# Patient Record
Sex: Female | Born: 1945
Health system: Southern US, Community
[De-identification: ages and names within clinical notes are randomized; demographics above are authoritative.]

## PROBLEM LIST (undated history)

## (undated) DIAGNOSIS — T7840XA Allergy, unspecified, initial encounter: Secondary | ICD-10-CM

## (undated) DIAGNOSIS — F32A Depression, unspecified: Secondary | ICD-10-CM

## (undated) DIAGNOSIS — G309 Alzheimer's disease, unspecified: Secondary | ICD-10-CM

## (undated) DIAGNOSIS — E785 Hyperlipidemia, unspecified: Secondary | ICD-10-CM

## (undated) DIAGNOSIS — F419 Anxiety disorder, unspecified: Secondary | ICD-10-CM

## (undated) DIAGNOSIS — F329 Major depressive disorder, single episode, unspecified: Secondary | ICD-10-CM

## (undated) DIAGNOSIS — F0280 Dementia in other diseases classified elsewhere without behavioral disturbance: Secondary | ICD-10-CM

## (undated) DIAGNOSIS — G40A01 Absence epileptic syndrome, not intractable, with status epilepticus: Secondary | ICD-10-CM

## (undated) DIAGNOSIS — F0390 Unspecified dementia without behavioral disturbance: Secondary | ICD-10-CM

## (undated) DIAGNOSIS — G43909 Migraine, unspecified, not intractable, without status migrainosus: Secondary | ICD-10-CM

## (undated) HISTORY — DX: Anxiety disorder, unspecified: F41.9

## (undated) HISTORY — DX: Allergy, unspecified, initial encounter: T78.40XA

## (undated) HISTORY — PX: ABDOMINAL HYSTERECTOMY: SHX81

## (undated) HISTORY — PX: CATARACT EXTRACTION: SUR2

## (undated) HISTORY — DX: Alzheimer's disease, unspecified: G30.9

## (undated) HISTORY — DX: Dementia in other diseases classified elsewhere, unspecified severity, without behavioral disturbance, psychotic disturbance, mood disturbance, and anxiety: F02.80

## (undated) HISTORY — DX: Migraine, unspecified, not intractable, without status migrainosus: G43.909

## (undated) HISTORY — DX: Absence epileptic syndrome, not intractable, with status epilepticus: G40.A01

## (undated) HISTORY — DX: Hyperlipidemia, unspecified: E78.5

## (undated) HISTORY — DX: Depression, unspecified: F32.A

## (undated) HISTORY — PX: OTHER SURGICAL HISTORY: SHX169

## (undated) HISTORY — DX: Major depressive disorder, single episode, unspecified: F32.9

---

## 1999-11-23 ENCOUNTER — Encounter: Payer: Self-pay | Admitting: Family Medicine

## 1999-11-23 ENCOUNTER — Encounter: Admission: RE | Admit: 1999-11-23 | Discharge: 1999-11-23 | Payer: Self-pay | Admitting: Family Medicine

## 2005-03-14 ENCOUNTER — Ambulatory Visit: Payer: Self-pay | Admitting: Internal Medicine

## 2005-06-26 ENCOUNTER — Ambulatory Visit: Payer: Self-pay | Admitting: Gynecologic Oncology

## 2005-09-18 ENCOUNTER — Ambulatory Visit: Payer: Self-pay | Admitting: Gynecologic Oncology

## 2005-12-25 ENCOUNTER — Ambulatory Visit: Payer: Self-pay | Admitting: Gynecologic Oncology

## 2006-05-28 ENCOUNTER — Ambulatory Visit: Payer: Self-pay | Admitting: Internal Medicine

## 2006-10-17 ENCOUNTER — Ambulatory Visit: Payer: Self-pay | Admitting: Gastroenterology

## 2006-11-19 ENCOUNTER — Ambulatory Visit: Payer: Self-pay | Admitting: Ophthalmology

## 2006-12-18 ENCOUNTER — Ambulatory Visit: Payer: Self-pay | Admitting: Family Medicine

## 2007-01-29 ENCOUNTER — Ambulatory Visit: Payer: Self-pay | Admitting: Internal Medicine

## 2007-03-06 ENCOUNTER — Ambulatory Visit: Payer: Self-pay | Admitting: Orthopaedic Surgery

## 2007-07-03 ENCOUNTER — Ambulatory Visit: Payer: Self-pay | Admitting: Internal Medicine

## 2008-01-28 ENCOUNTER — Ambulatory Visit: Payer: Self-pay | Admitting: Internal Medicine

## 2008-02-01 ENCOUNTER — Ambulatory Visit: Payer: Self-pay | Admitting: Internal Medicine

## 2008-09-28 ENCOUNTER — Ambulatory Visit: Payer: Self-pay | Admitting: Internal Medicine

## 2010-04-10 ENCOUNTER — Ambulatory Visit: Payer: Self-pay | Admitting: Internal Medicine

## 2010-05-02 ENCOUNTER — Emergency Department: Payer: Self-pay | Admitting: Emergency Medicine

## 2011-06-25 ENCOUNTER — Ambulatory Visit: Payer: Self-pay | Admitting: Internal Medicine

## 2011-12-20 ENCOUNTER — Ambulatory Visit: Payer: Self-pay | Admitting: Neurology

## 2012-06-10 ENCOUNTER — Ambulatory Visit: Payer: Self-pay | Admitting: Ophthalmology

## 2012-06-26 ENCOUNTER — Ambulatory Visit: Payer: Self-pay | Admitting: Internal Medicine

## 2012-09-09 ENCOUNTER — Inpatient Hospital Stay (HOSPITAL_COMMUNITY): Payer: Self-pay | Admitting: Occupational Therapy

## 2012-09-09 ENCOUNTER — Inpatient Hospital Stay (HOSPITAL_COMMUNITY): Payer: Self-pay

## 2012-09-10 ENCOUNTER — Inpatient Hospital Stay (HOSPITAL_COMMUNITY): Payer: Self-pay

## 2012-09-10 ENCOUNTER — Inpatient Hospital Stay (HOSPITAL_COMMUNITY): Payer: Self-pay | Admitting: Occupational Therapy

## 2013-06-28 ENCOUNTER — Ambulatory Visit: Payer: Self-pay

## 2014-04-14 DIAGNOSIS — F0280 Dementia in other diseases classified elsewhere without behavioral disturbance: Secondary | ICD-10-CM | POA: Diagnosis not present

## 2014-04-14 DIAGNOSIS — G301 Alzheimer's disease with late onset: Secondary | ICD-10-CM | POA: Diagnosis not present

## 2014-05-03 DIAGNOSIS — E039 Hypothyroidism, unspecified: Secondary | ICD-10-CM | POA: Diagnosis not present

## 2014-05-03 DIAGNOSIS — E782 Mixed hyperlipidemia: Secondary | ICD-10-CM | POA: Diagnosis not present

## 2014-05-03 DIAGNOSIS — M67442 Ganglion, left hand: Secondary | ICD-10-CM | POA: Diagnosis not present

## 2014-05-03 DIAGNOSIS — Z0001 Encounter for general adult medical examination with abnormal findings: Secondary | ICD-10-CM | POA: Diagnosis not present

## 2014-05-03 DIAGNOSIS — M810 Age-related osteoporosis without current pathological fracture: Secondary | ICD-10-CM | POA: Diagnosis not present

## 2014-05-03 DIAGNOSIS — D510 Vitamin B12 deficiency anemia due to intrinsic factor deficiency: Secondary | ICD-10-CM | POA: Diagnosis not present

## 2014-05-03 DIAGNOSIS — G309 Alzheimer's disease, unspecified: Secondary | ICD-10-CM | POA: Diagnosis not present

## 2014-05-03 DIAGNOSIS — E559 Vitamin D deficiency, unspecified: Secondary | ICD-10-CM | POA: Diagnosis not present

## 2014-05-05 DIAGNOSIS — M67442 Ganglion, left hand: Secondary | ICD-10-CM | POA: Diagnosis not present

## 2014-05-19 DIAGNOSIS — Z961 Presence of intraocular lens: Secondary | ICD-10-CM | POA: Diagnosis not present

## 2014-07-06 DIAGNOSIS — Z1231 Encounter for screening mammogram for malignant neoplasm of breast: Secondary | ICD-10-CM | POA: Diagnosis not present

## 2014-07-22 NOTE — Op Note (Signed)
PATIENT NAME:  Joan Peters, TINCH MR#:  [id number] DATE OF BIRTH:  19-Feb-1946  DATE OF PROCEDURE:  06/10/2012  PREOPERATIVE DIAGNOSIS:  Senile cataract, left eye.  POSTOPERATIVE DIAGNOSIS:  Senile cataract, left eye.  PROCEDURE:  Phacoemulsification with posterior chamber intraocular lens implantation of the left eye.  LENS: ZCBOO 20.5-diopter posterior chamber intraocular lens.  ULTRASOUND TIME:  12% of 1 minute, 23 seconds.  CDE 9.7.   SURGEON:  Mali Jaelle Campanile, MD  ANESTHESIA:  Topical with tetracaine drops and 2% Xylocaine jelly.  COMPLICATIONS:  None.  DESCRIPTION OF PROCEDURE:  The patient was identified in the holding room and transported to the operating room and placed in the supine position under the operating microscope.  The left eye was identified as the operative eye and it was prepped and draped in the usual sterile ophthalmic fashion.  A 1 millimeter clear-corneal paracentesis was made at the 1:30 position.  The anterior chamber was filled with Viscoat viscoelastic.  A 2.4 millimeter keratome was used to make a near-clear corneal incision at the 10:30 position.  A curvilinear capsulorrhexis was made with a cystotome and capsulorrhexis forceps.  Balanced salt solution was used to hydrodissect and hydrodelineate the nucleus.  Phacoemulsification was then used in stop and chop fashion to remove the lens nucleus and epinucleus.  The remaining cortex was then removed using the irrigation and aspiration handpiece. Provisc was then placed into the capsular bag to distend it for lens placement.  A ZCBOO 20.5-diopter lens was then injected into the capsular bag.  The remaining viscoelastic was aspirated.  Wounds were hydrated with balanced salt solution.  The anterior chamber was inflated to a physiologic pressure with balanced salt solution.  0.1 mL of cefuroxime 10 mg/mL were injected into the anterior chamber for a dose of 1 mg of intracameral antibiotic at the completion of the case.  Miostat was placed into the anterior chamber to constrict the pupil.  No wound leaks were noted.  Topical Vigamox drops and Maxitrol ointment were applied to the eye.  The patient was taken to the recovery room in stable condition without complications of anesthesia or surgery.   ____________________________ Wyonia Hough, MD crb:cb D: 06/10/2012 16:04:11 ET T: 06/10/2012 16:38:33 ET JOB#: [id number]  cc: Wyonia Hough, MD, <Dictator> Leandrew Koyanagi MD ELECTRONICALLY SIGNED 06/17/2012 12:47

## 2014-10-18 DIAGNOSIS — G301 Alzheimer's disease with late onset: Secondary | ICD-10-CM | POA: Diagnosis not present

## 2014-10-18 DIAGNOSIS — R569 Unspecified convulsions: Secondary | ICD-10-CM | POA: Diagnosis not present

## 2014-10-18 DIAGNOSIS — F0280 Dementia in other diseases classified elsewhere without behavioral disturbance: Secondary | ICD-10-CM | POA: Diagnosis not present

## 2014-10-31 DIAGNOSIS — Z0000 Encounter for general adult medical examination without abnormal findings: Secondary | ICD-10-CM | POA: Diagnosis not present

## 2014-11-01 DIAGNOSIS — G309 Alzheimer's disease, unspecified: Secondary | ICD-10-CM | POA: Diagnosis not present

## 2014-11-01 DIAGNOSIS — Z0001 Encounter for general adult medical examination with abnormal findings: Secondary | ICD-10-CM | POA: Diagnosis not present

## 2014-11-01 DIAGNOSIS — E782 Mixed hyperlipidemia: Secondary | ICD-10-CM | POA: Diagnosis not present

## 2014-11-01 DIAGNOSIS — M810 Age-related osteoporosis without current pathological fracture: Secondary | ICD-10-CM | POA: Diagnosis not present

## 2014-11-01 DIAGNOSIS — F411 Generalized anxiety disorder: Secondary | ICD-10-CM | POA: Diagnosis not present

## 2014-11-08 DIAGNOSIS — E2839 Other primary ovarian failure: Secondary | ICD-10-CM | POA: Diagnosis not present

## 2014-11-08 DIAGNOSIS — M810 Age-related osteoporosis without current pathological fracture: Secondary | ICD-10-CM | POA: Diagnosis not present

## 2014-11-08 DIAGNOSIS — M85862 Other specified disorders of bone density and structure, left lower leg: Secondary | ICD-10-CM | POA: Diagnosis not present

## 2015-04-20 DIAGNOSIS — G43109 Migraine with aura, not intractable, without status migrainosus: Secondary | ICD-10-CM | POA: Diagnosis not present

## 2015-04-20 DIAGNOSIS — F0280 Dementia in other diseases classified elsewhere without behavioral disturbance: Secondary | ICD-10-CM | POA: Diagnosis not present

## 2015-04-20 DIAGNOSIS — R569 Unspecified convulsions: Secondary | ICD-10-CM | POA: Diagnosis not present

## 2015-04-20 DIAGNOSIS — G301 Alzheimer's disease with late onset: Secondary | ICD-10-CM | POA: Diagnosis not present

## 2015-05-01 DIAGNOSIS — E039 Hypothyroidism, unspecified: Secondary | ICD-10-CM | POA: Diagnosis not present

## 2015-05-01 DIAGNOSIS — E782 Mixed hyperlipidemia: Secondary | ICD-10-CM | POA: Diagnosis not present

## 2015-05-01 DIAGNOSIS — G309 Alzheimer's disease, unspecified: Secondary | ICD-10-CM | POA: Diagnosis not present

## 2015-05-01 DIAGNOSIS — M810 Age-related osteoporosis without current pathological fracture: Secondary | ICD-10-CM | POA: Diagnosis not present

## 2015-05-01 DIAGNOSIS — F411 Generalized anxiety disorder: Secondary | ICD-10-CM | POA: Diagnosis not present

## 2015-05-18 DIAGNOSIS — I10 Essential (primary) hypertension: Secondary | ICD-10-CM | POA: Diagnosis not present

## 2015-05-18 DIAGNOSIS — E559 Vitamin D deficiency, unspecified: Secondary | ICD-10-CM | POA: Diagnosis not present

## 2015-05-18 DIAGNOSIS — Z0001 Encounter for general adult medical examination with abnormal findings: Secondary | ICD-10-CM | POA: Diagnosis not present

## 2015-05-18 DIAGNOSIS — E782 Mixed hyperlipidemia: Secondary | ICD-10-CM | POA: Diagnosis not present

## 2015-06-07 DIAGNOSIS — Z85828 Personal history of other malignant neoplasm of skin: Secondary | ICD-10-CM | POA: Diagnosis not present

## 2015-06-07 DIAGNOSIS — D1801 Hemangioma of skin and subcutaneous tissue: Secondary | ICD-10-CM | POA: Diagnosis not present

## 2015-06-07 DIAGNOSIS — L821 Other seborrheic keratosis: Secondary | ICD-10-CM | POA: Diagnosis not present

## 2015-07-10 DIAGNOSIS — Z1231 Encounter for screening mammogram for malignant neoplasm of breast: Secondary | ICD-10-CM | POA: Diagnosis not present

## 2015-08-10 DIAGNOSIS — E782 Mixed hyperlipidemia: Secondary | ICD-10-CM | POA: Diagnosis not present

## 2015-08-10 DIAGNOSIS — G309 Alzheimer's disease, unspecified: Secondary | ICD-10-CM | POA: Diagnosis not present

## 2015-08-10 DIAGNOSIS — R55 Syncope and collapse: Secondary | ICD-10-CM | POA: Diagnosis not present

## 2015-08-11 DIAGNOSIS — D509 Iron deficiency anemia, unspecified: Secondary | ICD-10-CM | POA: Diagnosis not present

## 2015-08-11 DIAGNOSIS — R55 Syncope and collapse: Secondary | ICD-10-CM | POA: Diagnosis not present

## 2015-08-23 DIAGNOSIS — R55 Syncope and collapse: Secondary | ICD-10-CM | POA: Diagnosis not present

## 2015-08-30 DIAGNOSIS — R55 Syncope and collapse: Secondary | ICD-10-CM | POA: Diagnosis not present

## 2015-09-14 DIAGNOSIS — E782 Mixed hyperlipidemia: Secondary | ICD-10-CM | POA: Diagnosis not present

## 2015-09-14 DIAGNOSIS — R55 Syncope and collapse: Secondary | ICD-10-CM | POA: Diagnosis not present

## 2015-09-14 DIAGNOSIS — I6523 Occlusion and stenosis of bilateral carotid arteries: Secondary | ICD-10-CM | POA: Diagnosis not present

## 2015-09-18 ENCOUNTER — Other Ambulatory Visit: Payer: Self-pay | Admitting: Nurse Practitioner

## 2015-09-18 DIAGNOSIS — I6523 Occlusion and stenosis of bilateral carotid arteries: Secondary | ICD-10-CM

## 2015-09-26 ENCOUNTER — Ambulatory Visit: Admission: RE | Admit: 2015-09-26 | Payer: Self-pay | Source: Ambulatory Visit

## 2015-10-25 ENCOUNTER — Ambulatory Visit
Admission: RE | Admit: 2015-10-25 | Discharge: 2015-10-25 | Disposition: A | Discharge status: Home / Self Care | Payer: Commercial Managed Care - HMO | Source: Ambulatory Visit | Attending: Nurse Practitioner | Admitting: Nurse Practitioner

## 2015-10-25 DIAGNOSIS — R55 Syncope and collapse: Secondary | ICD-10-CM | POA: Diagnosis not present

## 2015-10-25 DIAGNOSIS — I6523 Occlusion and stenosis of bilateral carotid arteries: Secondary | ICD-10-CM | POA: Insufficient documentation

## 2015-10-25 LAB — POCT I-STAT CREATININE: Creatinine, Ser: 0.7 mg/dL (ref 0.44–1.00)

## 2015-10-25 MED ORDER — IOPAMIDOL (ISOVUE-370) INJECTION 76%: 75.0000 mL | Freq: Once | INTRAVENOUS | Status: DC | PRN

## 2015-11-07 DIAGNOSIS — Z0001 Encounter for general adult medical examination with abnormal findings: Secondary | ICD-10-CM | POA: Diagnosis not present

## 2015-11-07 DIAGNOSIS — M810 Age-related osteoporosis without current pathological fracture: Secondary | ICD-10-CM | POA: Diagnosis not present

## 2015-11-07 DIAGNOSIS — E782 Mixed hyperlipidemia: Secondary | ICD-10-CM | POA: Diagnosis not present

## 2015-11-07 DIAGNOSIS — E039 Hypothyroidism, unspecified: Secondary | ICD-10-CM | POA: Diagnosis not present

## 2015-11-07 DIAGNOSIS — I6523 Occlusion and stenosis of bilateral carotid arteries: Secondary | ICD-10-CM | POA: Diagnosis not present

## 2015-11-07 DIAGNOSIS — G309 Alzheimer's disease, unspecified: Secondary | ICD-10-CM | POA: Diagnosis not present

## 2015-12-19 DIAGNOSIS — G301 Alzheimer's disease with late onset: Secondary | ICD-10-CM | POA: Diagnosis not present

## 2015-12-19 DIAGNOSIS — F0280 Dementia in other diseases classified elsewhere without behavioral disturbance: Secondary | ICD-10-CM | POA: Diagnosis not present

## 2015-12-19 DIAGNOSIS — R569 Unspecified convulsions: Secondary | ICD-10-CM | POA: Diagnosis not present

## 2015-12-19 DIAGNOSIS — G43109 Migraine with aura, not intractable, without status migrainosus: Secondary | ICD-10-CM | POA: Diagnosis not present

## 2016-03-27 DIAGNOSIS — G301 Alzheimer's disease with late onset: Secondary | ICD-10-CM | POA: Diagnosis not present

## 2016-03-27 DIAGNOSIS — G43109 Migraine with aura, not intractable, without status migrainosus: Secondary | ICD-10-CM | POA: Diagnosis not present

## 2016-03-27 DIAGNOSIS — R569 Unspecified convulsions: Secondary | ICD-10-CM | POA: Diagnosis not present

## 2016-03-27 DIAGNOSIS — F0280 Dementia in other diseases classified elsewhere without behavioral disturbance: Secondary | ICD-10-CM | POA: Diagnosis not present

## 2016-05-07 DIAGNOSIS — G309 Alzheimer's disease, unspecified: Secondary | ICD-10-CM | POA: Diagnosis not present

## 2016-05-07 DIAGNOSIS — M810 Age-related osteoporosis without current pathological fracture: Secondary | ICD-10-CM | POA: Diagnosis not present

## 2016-05-07 DIAGNOSIS — E782 Mixed hyperlipidemia: Secondary | ICD-10-CM | POA: Diagnosis not present

## 2016-05-07 DIAGNOSIS — F411 Generalized anxiety disorder: Secondary | ICD-10-CM | POA: Diagnosis not present

## 2016-05-07 DIAGNOSIS — D510 Vitamin B12 deficiency anemia due to intrinsic factor deficiency: Secondary | ICD-10-CM | POA: Diagnosis not present

## 2016-05-10 DIAGNOSIS — G309 Alzheimer's disease, unspecified: Secondary | ICD-10-CM | POA: Diagnosis not present

## 2016-06-25 DIAGNOSIS — R569 Unspecified convulsions: Secondary | ICD-10-CM | POA: Diagnosis not present

## 2016-06-25 DIAGNOSIS — G43109 Migraine with aura, not intractable, without status migrainosus: Secondary | ICD-10-CM | POA: Diagnosis not present

## 2016-06-25 DIAGNOSIS — G301 Alzheimer's disease with late onset: Secondary | ICD-10-CM | POA: Diagnosis not present

## 2016-06-25 DIAGNOSIS — F0280 Dementia in other diseases classified elsewhere without behavioral disturbance: Secondary | ICD-10-CM | POA: Diagnosis not present

## 2016-11-26 DIAGNOSIS — Z0001 Encounter for general adult medical examination with abnormal findings: Secondary | ICD-10-CM | POA: Diagnosis not present

## 2016-11-26 DIAGNOSIS — M1380 Other specified arthritis, unspecified site: Secondary | ICD-10-CM | POA: Diagnosis not present

## 2016-11-26 DIAGNOSIS — F411 Generalized anxiety disorder: Secondary | ICD-10-CM | POA: Diagnosis not present

## 2016-11-26 DIAGNOSIS — G309 Alzheimer's disease, unspecified: Secondary | ICD-10-CM | POA: Diagnosis not present

## 2016-11-26 DIAGNOSIS — E559 Vitamin D deficiency, unspecified: Secondary | ICD-10-CM | POA: Diagnosis not present

## 2016-11-26 DIAGNOSIS — E039 Hypothyroidism, unspecified: Secondary | ICD-10-CM | POA: Diagnosis not present

## 2016-11-26 DIAGNOSIS — E782 Mixed hyperlipidemia: Secondary | ICD-10-CM | POA: Diagnosis not present

## 2016-11-26 DIAGNOSIS — M810 Age-related osteoporosis without current pathological fracture: Secondary | ICD-10-CM | POA: Diagnosis not present

## 2016-11-27 DIAGNOSIS — Z0001 Encounter for general adult medical examination with abnormal findings: Secondary | ICD-10-CM | POA: Diagnosis not present

## 2016-11-27 DIAGNOSIS — D510 Vitamin B12 deficiency anemia due to intrinsic factor deficiency: Secondary | ICD-10-CM | POA: Diagnosis not present

## 2016-11-27 DIAGNOSIS — E782 Mixed hyperlipidemia: Secondary | ICD-10-CM | POA: Diagnosis not present

## 2016-11-27 DIAGNOSIS — E559 Vitamin D deficiency, unspecified: Secondary | ICD-10-CM | POA: Diagnosis not present

## 2016-11-27 DIAGNOSIS — E039 Hypothyroidism, unspecified: Secondary | ICD-10-CM | POA: Diagnosis not present

## 2016-12-19 DIAGNOSIS — Z780 Asymptomatic menopausal state: Secondary | ICD-10-CM | POA: Diagnosis not present

## 2016-12-19 DIAGNOSIS — M85852 Other specified disorders of bone density and structure, left thigh: Secondary | ICD-10-CM | POA: Diagnosis not present

## 2016-12-19 DIAGNOSIS — M8589 Other specified disorders of bone density and structure, multiple sites: Secondary | ICD-10-CM | POA: Diagnosis not present

## 2016-12-19 DIAGNOSIS — M810 Age-related osteoporosis without current pathological fracture: Secondary | ICD-10-CM | POA: Diagnosis not present

## 2016-12-19 DIAGNOSIS — M8588 Other specified disorders of bone density and structure, other site: Secondary | ICD-10-CM | POA: Diagnosis not present

## 2016-12-26 DIAGNOSIS — G301 Alzheimer's disease with late onset: Secondary | ICD-10-CM | POA: Diagnosis not present

## 2016-12-26 DIAGNOSIS — R569 Unspecified convulsions: Secondary | ICD-10-CM | POA: Diagnosis not present

## 2016-12-26 DIAGNOSIS — F0280 Dementia in other diseases classified elsewhere without behavioral disturbance: Secondary | ICD-10-CM | POA: Diagnosis not present

## 2016-12-26 DIAGNOSIS — G43109 Migraine with aura, not intractable, without status migrainosus: Secondary | ICD-10-CM | POA: Diagnosis not present

## 2016-12-30 ENCOUNTER — Other Ambulatory Visit: Payer: Self-pay | Admitting: Ophthalmology

## 2016-12-30 DIAGNOSIS — H53461 Homonymous bilateral field defects, right side: Secondary | ICD-10-CM | POA: Diagnosis not present

## 2017-01-06 ENCOUNTER — Ambulatory Visit
Admission: RE | Admit: 2017-01-06 | Discharge: 2017-01-06 | Disposition: A | Discharge status: Home / Self Care | Payer: Medicare HMO | Source: Ambulatory Visit | Attending: Ophthalmology | Admitting: Ophthalmology

## 2017-01-06 DIAGNOSIS — H53461 Homonymous bilateral field defects, right side: Secondary | ICD-10-CM | POA: Insufficient documentation

## 2017-01-06 DIAGNOSIS — R410 Disorientation, unspecified: Secondary | ICD-10-CM | POA: Diagnosis not present

## 2017-01-06 LAB — POCT I-STAT CREATININE: Creatinine, Ser: 0.5 mg/dL (ref 0.44–1.00)

## 2017-01-06 MED ORDER — GADOBENATE DIMEGLUMINE 529 MG/ML IV SOLN
11.0000 mL | Freq: Once | INTRAVENOUS | Status: AC | PRN
  Administered 2017-01-06: 11 mL via INTRAVENOUS

## 2017-02-07 ENCOUNTER — Emergency Department
Admission: EM | Admit: 2017-02-07 | Discharge: 2017-02-07 | Disposition: A | Discharge status: Home / Self Care | Payer: Medicare HMO | Attending: Emergency Medicine | Admitting: Emergency Medicine

## 2017-02-07 ENCOUNTER — Other Ambulatory Visit: Payer: Self-pay

## 2017-02-07 ENCOUNTER — Encounter: Payer: Self-pay | Admitting: Emergency Medicine

## 2017-02-07 DIAGNOSIS — G43109 Migraine with aura, not intractable, without status migrainosus: Secondary | ICD-10-CM | POA: Diagnosis not present

## 2017-02-07 DIAGNOSIS — F0280 Dementia in other diseases classified elsewhere without behavioral disturbance: Secondary | ICD-10-CM | POA: Diagnosis not present

## 2017-02-07 DIAGNOSIS — R4182 Altered mental status, unspecified: Secondary | ICD-10-CM | POA: Diagnosis present

## 2017-02-07 DIAGNOSIS — R451 Restlessness and agitation: Secondary | ICD-10-CM | POA: Diagnosis not present

## 2017-02-07 DIAGNOSIS — F0390 Unspecified dementia without behavioral disturbance: Secondary | ICD-10-CM | POA: Insufficient documentation

## 2017-02-07 DIAGNOSIS — R443 Hallucinations, unspecified: Secondary | ICD-10-CM

## 2017-02-07 DIAGNOSIS — R569 Unspecified convulsions: Secondary | ICD-10-CM | POA: Diagnosis not present

## 2017-02-07 DIAGNOSIS — Z79899 Other long term (current) drug therapy: Secondary | ICD-10-CM | POA: Diagnosis not present

## 2017-02-07 DIAGNOSIS — Z8659 Personal history of other mental and behavioral disorders: Secondary | ICD-10-CM | POA: Diagnosis not present

## 2017-02-07 DIAGNOSIS — G301 Alzheimer's disease with late onset: Secondary | ICD-10-CM | POA: Diagnosis not present

## 2017-02-07 HISTORY — DX: Unspecified dementia, unspecified severity, without behavioral disturbance, psychotic disturbance, mood disturbance, and anxiety: F03.90

## 2017-02-07 LAB — URINALYSIS, COMPLETE (UACMP) WITH MICROSCOPIC
BILIRUBIN URINE: NEGATIVE
Bacteria, UA: NONE SEEN
GLUCOSE, UA: NEGATIVE mg/dL
HGB URINE DIPSTICK: NEGATIVE
KETONES UR: 20 mg/dL — AB
LEUKOCYTES UA: NEGATIVE
Nitrite: NEGATIVE
PH: 5 (ref 5.0–8.0)
PROTEIN: 30 mg/dL — AB
Specific Gravity, Urine: 1.024 (ref 1.005–1.030)
Squamous Epithelial / LPF: NONE SEEN

## 2017-02-07 LAB — COMPREHENSIVE METABOLIC PANEL
ALT: 12 U/L — ABNORMAL LOW (ref 14–54)
ANION GAP: 12 (ref 5–15)
AST: 19 U/L (ref 15–41)
Albumin: 3.9 g/dL (ref 3.5–5.0)
Alkaline Phosphatase: 88 U/L (ref 38–126)
BUN: 18 mg/dL (ref 6–20)
CHLORIDE: 99 mmol/L — AB (ref 101–111)
CO2: 26 mmol/L (ref 22–32)
Calcium: 9.3 mg/dL (ref 8.9–10.3)
Creatinine, Ser: 0.53 mg/dL (ref 0.44–1.00)
Glucose, Bld: 85 mg/dL (ref 65–99)
POTASSIUM: 3.8 mmol/L (ref 3.5–5.1)
Sodium: 137 mmol/L (ref 135–145)
TOTAL PROTEIN: 6.9 g/dL (ref 6.5–8.1)
Total Bilirubin: 0.5 mg/dL (ref 0.3–1.2)

## 2017-02-07 NOTE — ED Triage Notes (Signed)
Pt has had increasing confusion over last 3 weeks.  Has been agitated and having hallucinations.  Hx dementia for last 6 years.  Neurology appt today and tried to obtain UA but pt became combative so sent to ED for UA.  Pt unable to give any history.

## 2017-02-07 NOTE — ED Provider Notes (Signed)
[facility] Emergency Department Provider Note  ____________________________________________  Time seen: Approximately 7:56 PM  I have reviewed the triage vital signs and the nursing notes.   HISTORY  Chief Complaint Altered Mental Status    HPI Joan Peters is a 71 y.o. female with a history of advanced dementia brought by her family for agitation and increasing hallucinations.  The patient's family states that for the past 8-10 weeks, she is started seeing people and other objects that are not there.  She underwent MRI 01/06/17 after the symptoms initiated, which showed generalized atrophy but no other acute process.  Over the past 2 weeks, the hallucinations have become more frequent, and the patient has begun to have intermittent agitation that is responding well to oral medications.  Today she went for follow-up with her neurologist who is able to perform basic laboratory studies, but the patient was unwilling to give any urine for sample.  The patient was brought to the emergency department for urinalysis.  I have had an at length discussion with the patient's husband, as well as stepdaughter, who both agree that the patient is safe at home and that they are only goal from today is evaluation for UTI.  They defer any additional work up for the patient's symptoms and feel her clinical course is mostly likely due to progression of her Alzheimer's dementia.    Past Medical History:  Diagnosis Date  . Dementia     There are no active problems to display for this patient.   Past Surgical History:  Procedure Laterality Date  . ABDOMINAL HYSTERECTOMY        Allergies Sulfa antibiotics  History reviewed. No pertinent family history.  Social History Social History   Tobacco Use  . Smoking status: Never Smoker  . Smokeless tobacco: Never Used  Substance Use Topics  . Alcohol use: Yes  . Drug use: No    Review of Systems Constitutional: No  fever/chills.  No lightheadedness or syncope.  No trauma. Eyes: No visual changes. ENT: No sore throat. No congestion or rhinorrhea. Cardiovascular: Denies chest pain. Denies palpitations. Respiratory: Denies shortness of breath.  No cough. Gastrointestinal: No abdominal pain.  No nausea, no vomiting.  No diarrhea.  No constipation. Genitourinary: Negative for dysuria.  No foul-smelling urine. Musculoskeletal: Negative for back pain. Skin: Negative for rash. Neurological: Negative for headaches. No focal numbness, tingling or weakness.  Psychiatric:Positive visual hallucinations.  Positive increasing agitation.  ____________________________________________   PHYSICAL EXAM:  VITAL SIGNS: ED Triage Vitals  Enc Vitals Group     BP 02/07/17 1544 (!) 155/74     Pulse Rate 02/07/17 1544 71     Resp 02/07/17 1544 16     Temp 02/07/17 1544 98.5 F (36.9 C)     Temp Source 02/07/17 1544 Oral     SpO2 02/07/17 1544 100 %     Weight 02/07/17 1545 119 lb (54 kg)     Height 02/07/17 1545 5\' 4"  (1.626 m)     Head Circumference --      Peak Flow --      Pain Score --      Pain Loc --      Pain Edu? --      Excl. in [place]? --     Constitutional: The patient is alert and answers basic questions but is not oriented and has clear signs of dementia.  GCS is 15. Eyes: Conjunctivae are normal.  EOMI. No scleral icterus. Head: Atraumatic.  Nose: No congestion/rhinnorhea. Mouth/Throat: Mucous membranes are moist.  Neck: No stridor.  Supple.  No JVD.  No meningismus. Cardiovascular: Normal rate, regular rhythm. No murmurs, rubs or gallops.  Respiratory: Normal respiratory effort.  No accessory muscle use or retractions. Lungs CTAB.  No wheezes, rales or ronchi. Gastrointestinal: Soft, nontender and nondistended.  No guarding or rebound.  No peritoneal signs. Musculoskeletal: No LE edema.  Neurologic:  A&Ox3.  Speech is clear.  Face and smile are symmetric.  EOMI.  Moves all extremities  well. Skin:  Skin is warm, dry and intact. No rash noted. Psychiatric: In the emergency department, the patient is calm without any evidence of agitation.  Her speech is normal.  She does not have good insight into why she is here.  ____________________________________________   LABS (all labs ordered are listed, but only abnormal results are displayed)  Labs Reviewed  URINALYSIS, COMPLETE (UACMP) WITH MICROSCOPIC - Abnormal; Notable for the following components:      Result Value   Color, Urine YELLOW (*)    APPearance CLEAR (*)    Ketones, ur 20 (*)    Protein, ur 30 (*)    All other components within normal limits  COMPREHENSIVE METABOLIC PANEL - Abnormal; Notable for the following components:   Chloride 99 (*)    ALT 12 (*)    All other components within normal limits   ____________________________________________  EKG  Not indicated ____________________________________________  RADIOLOGY  No results found.  ____________________________________________   PROCEDURES  Procedure(s) performed: None  Procedures  Critical Care performed: No ____________________________________________   INITIAL IMPRESSION / ASSESSMENT AND PLAN / ED COURSE  Pertinent labs & imaging results that were available during my care of the patient were reviewed by me and considered in my medical decision making (see chart for details).  71 y.o. female presenting to the emergency department for inability to obtain a urinalysis at her neurologist's office after increasing visual hallucinations and agitation at home.  The patient has had negative recent imaging, that was performed after the onset of hallucinations.  She has not had any other signs or symptoms that would be concerning for a traumatic or infectious event that would lead to this.  We will perform a urinalysis here.  ----------------------------------------- 8:03 PM on 02/07/2017 -----------------------------------------  The  patient's urinalysis does not show any infection.  I have discussed the findings with the patient and her family, who are in agreement with the plan for discharge and close PMD or neurology follow-up.  ____________________________________________  FINAL CLINICAL IMPRESSION(S) / ED DIAGNOSES  Final diagnoses:  Hallucinations  Agitation         NEW MEDICATIONS STARTED DURING THIS VISIT:  This SmartLink is deprecated. Use AVSMEDLIST instead to display the medication list for a patient.    Eula Listen, MD 02/07/17 2003

## 2017-02-07 NOTE — Discharge Instructions (Signed)
Please return to the emergency department for changes in mental status, fever, vomiting, or any other symptoms concerning to you.

## 2017-02-07 NOTE — ED Notes (Addendum)
Pt had labs drawn from [facility].  CBC in care everywhere.  CMP/TSH also drawn.  Spoke with [facility] but unsure if CMP will result. This test done in ED

## 2017-02-11 DIAGNOSIS — F0280 Dementia in other diseases classified elsewhere without behavioral disturbance: Secondary | ICD-10-CM | POA: Diagnosis not present

## 2017-02-11 DIAGNOSIS — R569 Unspecified convulsions: Secondary | ICD-10-CM | POA: Diagnosis not present

## 2017-02-11 DIAGNOSIS — G301 Alzheimer's disease with late onset: Secondary | ICD-10-CM | POA: Diagnosis not present

## 2017-02-11 DIAGNOSIS — G43109 Migraine with aura, not intractable, without status migrainosus: Secondary | ICD-10-CM | POA: Diagnosis not present

## 2017-02-11 DIAGNOSIS — F419 Anxiety disorder, unspecified: Secondary | ICD-10-CM | POA: Diagnosis not present

## 2017-02-11 DIAGNOSIS — F329 Major depressive disorder, single episode, unspecified: Secondary | ICD-10-CM | POA: Diagnosis not present

## 2017-02-11 DIAGNOSIS — Z7982 Long term (current) use of aspirin: Secondary | ICD-10-CM | POA: Diagnosis not present

## 2017-02-17 DIAGNOSIS — G43109 Migraine with aura, not intractable, without status migrainosus: Secondary | ICD-10-CM | POA: Diagnosis not present

## 2017-02-17 DIAGNOSIS — F329 Major depressive disorder, single episode, unspecified: Secondary | ICD-10-CM | POA: Diagnosis not present

## 2017-02-17 DIAGNOSIS — F0280 Dementia in other diseases classified elsewhere without behavioral disturbance: Secondary | ICD-10-CM | POA: Diagnosis not present

## 2017-02-17 DIAGNOSIS — R569 Unspecified convulsions: Secondary | ICD-10-CM | POA: Diagnosis not present

## 2017-02-17 DIAGNOSIS — G301 Alzheimer's disease with late onset: Secondary | ICD-10-CM | POA: Diagnosis not present

## 2017-02-17 DIAGNOSIS — F419 Anxiety disorder, unspecified: Secondary | ICD-10-CM | POA: Diagnosis not present

## 2017-02-17 DIAGNOSIS — Z7982 Long term (current) use of aspirin: Secondary | ICD-10-CM | POA: Diagnosis not present

## 2017-02-18 DIAGNOSIS — R569 Unspecified convulsions: Secondary | ICD-10-CM | POA: Diagnosis not present

## 2017-02-18 DIAGNOSIS — F0280 Dementia in other diseases classified elsewhere without behavioral disturbance: Secondary | ICD-10-CM | POA: Diagnosis not present

## 2017-02-18 DIAGNOSIS — G301 Alzheimer's disease with late onset: Secondary | ICD-10-CM | POA: Diagnosis not present

## 2017-02-18 DIAGNOSIS — G43109 Migraine with aura, not intractable, without status migrainosus: Secondary | ICD-10-CM | POA: Diagnosis not present

## 2017-02-18 DIAGNOSIS — Z7982 Long term (current) use of aspirin: Secondary | ICD-10-CM | POA: Diagnosis not present

## 2017-02-18 DIAGNOSIS — F419 Anxiety disorder, unspecified: Secondary | ICD-10-CM | POA: Diagnosis not present

## 2017-02-18 DIAGNOSIS — F329 Major depressive disorder, single episode, unspecified: Secondary | ICD-10-CM | POA: Diagnosis not present

## 2017-02-25 DIAGNOSIS — R569 Unspecified convulsions: Secondary | ICD-10-CM | POA: Diagnosis not present

## 2017-02-25 DIAGNOSIS — G301 Alzheimer's disease with late onset: Secondary | ICD-10-CM | POA: Diagnosis not present

## 2017-02-25 DIAGNOSIS — F419 Anxiety disorder, unspecified: Secondary | ICD-10-CM | POA: Diagnosis not present

## 2017-02-25 DIAGNOSIS — F329 Major depressive disorder, single episode, unspecified: Secondary | ICD-10-CM | POA: Diagnosis not present

## 2017-02-25 DIAGNOSIS — G43109 Migraine with aura, not intractable, without status migrainosus: Secondary | ICD-10-CM | POA: Diagnosis not present

## 2017-02-25 DIAGNOSIS — Z7982 Long term (current) use of aspirin: Secondary | ICD-10-CM | POA: Diagnosis not present

## 2017-02-25 DIAGNOSIS — F0280 Dementia in other diseases classified elsewhere without behavioral disturbance: Secondary | ICD-10-CM | POA: Diagnosis not present

## 2017-02-26 DIAGNOSIS — F419 Anxiety disorder, unspecified: Secondary | ICD-10-CM | POA: Diagnosis not present

## 2017-02-26 DIAGNOSIS — R569 Unspecified convulsions: Secondary | ICD-10-CM | POA: Diagnosis not present

## 2017-02-26 DIAGNOSIS — Z7982 Long term (current) use of aspirin: Secondary | ICD-10-CM | POA: Diagnosis not present

## 2017-02-26 DIAGNOSIS — G43109 Migraine with aura, not intractable, without status migrainosus: Secondary | ICD-10-CM | POA: Diagnosis not present

## 2017-02-26 DIAGNOSIS — F329 Major depressive disorder, single episode, unspecified: Secondary | ICD-10-CM | POA: Diagnosis not present

## 2017-02-26 DIAGNOSIS — G301 Alzheimer's disease with late onset: Secondary | ICD-10-CM | POA: Diagnosis not present

## 2017-02-26 DIAGNOSIS — F0280 Dementia in other diseases classified elsewhere without behavioral disturbance: Secondary | ICD-10-CM | POA: Diagnosis not present

## 2017-02-27 DIAGNOSIS — F419 Anxiety disorder, unspecified: Secondary | ICD-10-CM | POA: Diagnosis not present

## 2017-02-27 DIAGNOSIS — F0280 Dementia in other diseases classified elsewhere without behavioral disturbance: Secondary | ICD-10-CM | POA: Diagnosis not present

## 2017-02-27 DIAGNOSIS — F329 Major depressive disorder, single episode, unspecified: Secondary | ICD-10-CM | POA: Diagnosis not present

## 2017-02-27 DIAGNOSIS — G301 Alzheimer's disease with late onset: Secondary | ICD-10-CM | POA: Diagnosis not present

## 2017-02-27 DIAGNOSIS — Z7982 Long term (current) use of aspirin: Secondary | ICD-10-CM | POA: Diagnosis not present

## 2017-02-27 DIAGNOSIS — R569 Unspecified convulsions: Secondary | ICD-10-CM | POA: Diagnosis not present

## 2017-02-27 DIAGNOSIS — G43109 Migraine with aura, not intractable, without status migrainosus: Secondary | ICD-10-CM | POA: Diagnosis not present

## 2017-03-03 DIAGNOSIS — F419 Anxiety disorder, unspecified: Secondary | ICD-10-CM | POA: Diagnosis not present

## 2017-03-03 DIAGNOSIS — Z7982 Long term (current) use of aspirin: Secondary | ICD-10-CM | POA: Diagnosis not present

## 2017-03-03 DIAGNOSIS — F329 Major depressive disorder, single episode, unspecified: Secondary | ICD-10-CM | POA: Diagnosis not present

## 2017-03-03 DIAGNOSIS — R569 Unspecified convulsions: Secondary | ICD-10-CM | POA: Diagnosis not present

## 2017-03-03 DIAGNOSIS — F0280 Dementia in other diseases classified elsewhere without behavioral disturbance: Secondary | ICD-10-CM | POA: Diagnosis not present

## 2017-03-03 DIAGNOSIS — G43109 Migraine with aura, not intractable, without status migrainosus: Secondary | ICD-10-CM | POA: Diagnosis not present

## 2017-03-03 DIAGNOSIS — G301 Alzheimer's disease with late onset: Secondary | ICD-10-CM | POA: Diagnosis not present

## 2017-03-05 DIAGNOSIS — Z7982 Long term (current) use of aspirin: Secondary | ICD-10-CM | POA: Diagnosis not present

## 2017-03-05 DIAGNOSIS — F419 Anxiety disorder, unspecified: Secondary | ICD-10-CM | POA: Diagnosis not present

## 2017-03-05 DIAGNOSIS — G43109 Migraine with aura, not intractable, without status migrainosus: Secondary | ICD-10-CM | POA: Diagnosis not present

## 2017-03-05 DIAGNOSIS — R569 Unspecified convulsions: Secondary | ICD-10-CM | POA: Diagnosis not present

## 2017-03-05 DIAGNOSIS — F329 Major depressive disorder, single episode, unspecified: Secondary | ICD-10-CM | POA: Diagnosis not present

## 2017-03-05 DIAGNOSIS — G301 Alzheimer's disease with late onset: Secondary | ICD-10-CM | POA: Diagnosis not present

## 2017-03-05 DIAGNOSIS — F0280 Dementia in other diseases classified elsewhere without behavioral disturbance: Secondary | ICD-10-CM | POA: Diagnosis not present

## 2017-03-06 DIAGNOSIS — Z7982 Long term (current) use of aspirin: Secondary | ICD-10-CM | POA: Diagnosis not present

## 2017-03-06 DIAGNOSIS — F329 Major depressive disorder, single episode, unspecified: Secondary | ICD-10-CM | POA: Diagnosis not present

## 2017-03-06 DIAGNOSIS — G43109 Migraine with aura, not intractable, without status migrainosus: Secondary | ICD-10-CM | POA: Diagnosis not present

## 2017-03-06 DIAGNOSIS — F0280 Dementia in other diseases classified elsewhere without behavioral disturbance: Secondary | ICD-10-CM | POA: Diagnosis not present

## 2017-03-06 DIAGNOSIS — R569 Unspecified convulsions: Secondary | ICD-10-CM | POA: Diagnosis not present

## 2017-03-06 DIAGNOSIS — F419 Anxiety disorder, unspecified: Secondary | ICD-10-CM | POA: Diagnosis not present

## 2017-03-06 DIAGNOSIS — G301 Alzheimer's disease with late onset: Secondary | ICD-10-CM | POA: Diagnosis not present

## 2017-03-07 DIAGNOSIS — G301 Alzheimer's disease with late onset: Secondary | ICD-10-CM | POA: Diagnosis not present

## 2017-03-07 DIAGNOSIS — F0281 Dementia in other diseases classified elsewhere with behavioral disturbance: Secondary | ICD-10-CM | POA: Diagnosis not present

## 2017-03-11 DIAGNOSIS — Z7982 Long term (current) use of aspirin: Secondary | ICD-10-CM | POA: Diagnosis not present

## 2017-03-11 DIAGNOSIS — G301 Alzheimer's disease with late onset: Secondary | ICD-10-CM | POA: Diagnosis not present

## 2017-03-11 DIAGNOSIS — G43109 Migraine with aura, not intractable, without status migrainosus: Secondary | ICD-10-CM | POA: Diagnosis not present

## 2017-03-11 DIAGNOSIS — F0280 Dementia in other diseases classified elsewhere without behavioral disturbance: Secondary | ICD-10-CM | POA: Diagnosis not present

## 2017-03-11 DIAGNOSIS — R569 Unspecified convulsions: Secondary | ICD-10-CM | POA: Diagnosis not present

## 2017-03-11 DIAGNOSIS — F419 Anxiety disorder, unspecified: Secondary | ICD-10-CM | POA: Diagnosis not present

## 2017-03-11 DIAGNOSIS — F329 Major depressive disorder, single episode, unspecified: Secondary | ICD-10-CM | POA: Diagnosis not present

## 2017-03-12 DIAGNOSIS — G43109 Migraine with aura, not intractable, without status migrainosus: Secondary | ICD-10-CM | POA: Diagnosis not present

## 2017-03-12 DIAGNOSIS — G301 Alzheimer's disease with late onset: Secondary | ICD-10-CM | POA: Diagnosis not present

## 2017-03-12 DIAGNOSIS — Z7982 Long term (current) use of aspirin: Secondary | ICD-10-CM | POA: Diagnosis not present

## 2017-03-12 DIAGNOSIS — F419 Anxiety disorder, unspecified: Secondary | ICD-10-CM | POA: Diagnosis not present

## 2017-03-12 DIAGNOSIS — F329 Major depressive disorder, single episode, unspecified: Secondary | ICD-10-CM | POA: Diagnosis not present

## 2017-03-12 DIAGNOSIS — F0280 Dementia in other diseases classified elsewhere without behavioral disturbance: Secondary | ICD-10-CM | POA: Diagnosis not present

## 2017-03-12 DIAGNOSIS — R569 Unspecified convulsions: Secondary | ICD-10-CM | POA: Diagnosis not present

## 2017-03-13 DIAGNOSIS — F0280 Dementia in other diseases classified elsewhere without behavioral disturbance: Secondary | ICD-10-CM | POA: Diagnosis not present

## 2017-03-13 DIAGNOSIS — Z7982 Long term (current) use of aspirin: Secondary | ICD-10-CM | POA: Diagnosis not present

## 2017-03-13 DIAGNOSIS — F329 Major depressive disorder, single episode, unspecified: Secondary | ICD-10-CM | POA: Diagnosis not present

## 2017-03-13 DIAGNOSIS — F419 Anxiety disorder, unspecified: Secondary | ICD-10-CM | POA: Diagnosis not present

## 2017-03-13 DIAGNOSIS — R569 Unspecified convulsions: Secondary | ICD-10-CM | POA: Diagnosis not present

## 2017-03-13 DIAGNOSIS — G43109 Migraine with aura, not intractable, without status migrainosus: Secondary | ICD-10-CM | POA: Diagnosis not present

## 2017-03-13 DIAGNOSIS — G301 Alzheimer's disease with late onset: Secondary | ICD-10-CM | POA: Diagnosis not present

## 2017-03-14 ENCOUNTER — Encounter: Payer: Self-pay | Admitting: *Deleted

## 2017-03-15 DIAGNOSIS — F329 Major depressive disorder, single episode, unspecified: Secondary | ICD-10-CM | POA: Diagnosis not present

## 2017-03-15 DIAGNOSIS — Z7982 Long term (current) use of aspirin: Secondary | ICD-10-CM | POA: Diagnosis not present

## 2017-03-15 DIAGNOSIS — G43109 Migraine with aura, not intractable, without status migrainosus: Secondary | ICD-10-CM | POA: Diagnosis not present

## 2017-03-15 DIAGNOSIS — F419 Anxiety disorder, unspecified: Secondary | ICD-10-CM | POA: Diagnosis not present

## 2017-03-15 DIAGNOSIS — G301 Alzheimer's disease with late onset: Secondary | ICD-10-CM | POA: Diagnosis not present

## 2017-03-15 DIAGNOSIS — R569 Unspecified convulsions: Secondary | ICD-10-CM | POA: Diagnosis not present

## 2017-03-15 DIAGNOSIS — F0280 Dementia in other diseases classified elsewhere without behavioral disturbance: Secondary | ICD-10-CM | POA: Diagnosis not present

## 2017-03-18 DIAGNOSIS — Z7982 Long term (current) use of aspirin: Secondary | ICD-10-CM | POA: Diagnosis not present

## 2017-03-18 DIAGNOSIS — G43109 Migraine with aura, not intractable, without status migrainosus: Secondary | ICD-10-CM | POA: Diagnosis not present

## 2017-03-18 DIAGNOSIS — R569 Unspecified convulsions: Secondary | ICD-10-CM | POA: Diagnosis not present

## 2017-03-18 DIAGNOSIS — G301 Alzheimer's disease with late onset: Secondary | ICD-10-CM | POA: Diagnosis not present

## 2017-03-18 DIAGNOSIS — F329 Major depressive disorder, single episode, unspecified: Secondary | ICD-10-CM | POA: Diagnosis not present

## 2017-03-18 DIAGNOSIS — F419 Anxiety disorder, unspecified: Secondary | ICD-10-CM | POA: Diagnosis not present

## 2017-03-18 DIAGNOSIS — F0280 Dementia in other diseases classified elsewhere without behavioral disturbance: Secondary | ICD-10-CM | POA: Diagnosis not present

## 2017-03-20 ENCOUNTER — Telehealth: Payer: Self-pay | Admitting: Gastroenterology

## 2017-03-20 DIAGNOSIS — F419 Anxiety disorder, unspecified: Secondary | ICD-10-CM | POA: Diagnosis not present

## 2017-03-20 DIAGNOSIS — F0280 Dementia in other diseases classified elsewhere without behavioral disturbance: Secondary | ICD-10-CM | POA: Diagnosis not present

## 2017-03-20 DIAGNOSIS — Z7982 Long term (current) use of aspirin: Secondary | ICD-10-CM | POA: Diagnosis not present

## 2017-03-20 DIAGNOSIS — R569 Unspecified convulsions: Secondary | ICD-10-CM | POA: Diagnosis not present

## 2017-03-20 DIAGNOSIS — G301 Alzheimer's disease with late onset: Secondary | ICD-10-CM | POA: Diagnosis not present

## 2017-03-20 DIAGNOSIS — F329 Major depressive disorder, single episode, unspecified: Secondary | ICD-10-CM | POA: Diagnosis not present

## 2017-03-20 DIAGNOSIS — G43109 Migraine with aura, not intractable, without status migrainosus: Secondary | ICD-10-CM | POA: Diagnosis not present

## 2017-03-20 NOTE — Telephone Encounter (Signed)
Patient called to schedule colonoscopy. Please call

## 2017-03-21 ENCOUNTER — Telehealth: Payer: Self-pay

## 2017-03-21 DIAGNOSIS — G301 Alzheimer's disease with late onset: Secondary | ICD-10-CM | POA: Diagnosis not present

## 2017-03-21 DIAGNOSIS — R569 Unspecified convulsions: Secondary | ICD-10-CM | POA: Diagnosis not present

## 2017-03-21 DIAGNOSIS — F419 Anxiety disorder, unspecified: Secondary | ICD-10-CM | POA: Diagnosis not present

## 2017-03-21 DIAGNOSIS — Z7982 Long term (current) use of aspirin: Secondary | ICD-10-CM | POA: Diagnosis not present

## 2017-03-21 DIAGNOSIS — G43109 Migraine with aura, not intractable, without status migrainosus: Secondary | ICD-10-CM | POA: Diagnosis not present

## 2017-03-21 DIAGNOSIS — F0280 Dementia in other diseases classified elsewhere without behavioral disturbance: Secondary | ICD-10-CM | POA: Diagnosis not present

## 2017-03-21 DIAGNOSIS — F329 Major depressive disorder, single episode, unspecified: Secondary | ICD-10-CM | POA: Diagnosis not present

## 2017-03-21 NOTE — Telephone Encounter (Signed)
Spoke with the patient's husband about scheduling a colonoscopy for the patient. He states that she has Dementia and she may not be able to complete the prep properly. He also states that she had Colo guard testing done last year and the results were Negative. He states that this was done thru his insurance company and that her primary care provider was unaware of this. I advised him to contact the patient's primary care provider and let them know about her testing and the results so they can update her records. He is amendable to this.

## 2017-03-23 DIAGNOSIS — R569 Unspecified convulsions: Secondary | ICD-10-CM | POA: Diagnosis not present

## 2017-03-23 DIAGNOSIS — G301 Alzheimer's disease with late onset: Secondary | ICD-10-CM | POA: Diagnosis not present

## 2017-03-23 DIAGNOSIS — F329 Major depressive disorder, single episode, unspecified: Secondary | ICD-10-CM | POA: Diagnosis not present

## 2017-03-23 DIAGNOSIS — F419 Anxiety disorder, unspecified: Secondary | ICD-10-CM | POA: Diagnosis not present

## 2017-03-23 DIAGNOSIS — F0280 Dementia in other diseases classified elsewhere without behavioral disturbance: Secondary | ICD-10-CM | POA: Diagnosis not present

## 2017-03-23 DIAGNOSIS — G43109 Migraine with aura, not intractable, without status migrainosus: Secondary | ICD-10-CM | POA: Diagnosis not present

## 2017-03-23 DIAGNOSIS — Z7982 Long term (current) use of aspirin: Secondary | ICD-10-CM | POA: Diagnosis not present

## 2017-03-24 DIAGNOSIS — G43109 Migraine with aura, not intractable, without status migrainosus: Secondary | ICD-10-CM | POA: Diagnosis not present

## 2017-03-24 DIAGNOSIS — F0280 Dementia in other diseases classified elsewhere without behavioral disturbance: Secondary | ICD-10-CM | POA: Diagnosis not present

## 2017-03-24 DIAGNOSIS — Z7982 Long term (current) use of aspirin: Secondary | ICD-10-CM | POA: Diagnosis not present

## 2017-03-24 DIAGNOSIS — F329 Major depressive disorder, single episode, unspecified: Secondary | ICD-10-CM | POA: Diagnosis not present

## 2017-03-24 DIAGNOSIS — G301 Alzheimer's disease with late onset: Secondary | ICD-10-CM | POA: Diagnosis not present

## 2017-03-24 DIAGNOSIS — R569 Unspecified convulsions: Secondary | ICD-10-CM | POA: Diagnosis not present

## 2017-03-24 DIAGNOSIS — F419 Anxiety disorder, unspecified: Secondary | ICD-10-CM | POA: Diagnosis not present

## 2017-03-27 DIAGNOSIS — G43109 Migraine with aura, not intractable, without status migrainosus: Secondary | ICD-10-CM | POA: Diagnosis not present

## 2017-03-27 DIAGNOSIS — F0280 Dementia in other diseases classified elsewhere without behavioral disturbance: Secondary | ICD-10-CM | POA: Diagnosis not present

## 2017-03-27 DIAGNOSIS — F329 Major depressive disorder, single episode, unspecified: Secondary | ICD-10-CM | POA: Diagnosis not present

## 2017-03-27 DIAGNOSIS — R569 Unspecified convulsions: Secondary | ICD-10-CM | POA: Diagnosis not present

## 2017-03-27 DIAGNOSIS — F419 Anxiety disorder, unspecified: Secondary | ICD-10-CM | POA: Diagnosis not present

## 2017-03-27 DIAGNOSIS — Z7982 Long term (current) use of aspirin: Secondary | ICD-10-CM | POA: Diagnosis not present

## 2017-03-27 DIAGNOSIS — G301 Alzheimer's disease with late onset: Secondary | ICD-10-CM | POA: Diagnosis not present

## 2017-03-31 DIAGNOSIS — Z7982 Long term (current) use of aspirin: Secondary | ICD-10-CM | POA: Diagnosis not present

## 2017-03-31 DIAGNOSIS — G43109 Migraine with aura, not intractable, without status migrainosus: Secondary | ICD-10-CM | POA: Diagnosis not present

## 2017-03-31 DIAGNOSIS — F329 Major depressive disorder, single episode, unspecified: Secondary | ICD-10-CM | POA: Diagnosis not present

## 2017-03-31 DIAGNOSIS — F0280 Dementia in other diseases classified elsewhere without behavioral disturbance: Secondary | ICD-10-CM | POA: Diagnosis not present

## 2017-03-31 DIAGNOSIS — G301 Alzheimer's disease with late onset: Secondary | ICD-10-CM | POA: Diagnosis not present

## 2017-03-31 DIAGNOSIS — F419 Anxiety disorder, unspecified: Secondary | ICD-10-CM | POA: Diagnosis not present

## 2017-03-31 DIAGNOSIS — R569 Unspecified convulsions: Secondary | ICD-10-CM | POA: Diagnosis not present

## 2017-04-03 DIAGNOSIS — Z7982 Long term (current) use of aspirin: Secondary | ICD-10-CM | POA: Diagnosis not present

## 2017-04-03 DIAGNOSIS — F0280 Dementia in other diseases classified elsewhere without behavioral disturbance: Secondary | ICD-10-CM | POA: Diagnosis not present

## 2017-04-03 DIAGNOSIS — R569 Unspecified convulsions: Secondary | ICD-10-CM | POA: Diagnosis not present

## 2017-04-03 DIAGNOSIS — F419 Anxiety disorder, unspecified: Secondary | ICD-10-CM | POA: Diagnosis not present

## 2017-04-03 DIAGNOSIS — G43109 Migraine with aura, not intractable, without status migrainosus: Secondary | ICD-10-CM | POA: Diagnosis not present

## 2017-04-03 DIAGNOSIS — F329 Major depressive disorder, single episode, unspecified: Secondary | ICD-10-CM | POA: Diagnosis not present

## 2017-04-03 DIAGNOSIS — G301 Alzheimer's disease with late onset: Secondary | ICD-10-CM | POA: Diagnosis not present

## 2017-04-07 DIAGNOSIS — F0280 Dementia in other diseases classified elsewhere without behavioral disturbance: Secondary | ICD-10-CM | POA: Diagnosis not present

## 2017-04-07 DIAGNOSIS — G301 Alzheimer's disease with late onset: Secondary | ICD-10-CM | POA: Diagnosis not present

## 2017-04-07 DIAGNOSIS — Z7982 Long term (current) use of aspirin: Secondary | ICD-10-CM | POA: Diagnosis not present

## 2017-04-07 DIAGNOSIS — G43109 Migraine with aura, not intractable, without status migrainosus: Secondary | ICD-10-CM | POA: Diagnosis not present

## 2017-04-07 DIAGNOSIS — F419 Anxiety disorder, unspecified: Secondary | ICD-10-CM | POA: Diagnosis not present

## 2017-04-07 DIAGNOSIS — R569 Unspecified convulsions: Secondary | ICD-10-CM | POA: Diagnosis not present

## 2017-04-07 DIAGNOSIS — F329 Major depressive disorder, single episode, unspecified: Secondary | ICD-10-CM | POA: Diagnosis not present

## 2017-04-08 DIAGNOSIS — R569 Unspecified convulsions: Secondary | ICD-10-CM | POA: Diagnosis not present

## 2017-04-08 DIAGNOSIS — G301 Alzheimer's disease with late onset: Secondary | ICD-10-CM | POA: Diagnosis not present

## 2017-04-08 DIAGNOSIS — F419 Anxiety disorder, unspecified: Secondary | ICD-10-CM | POA: Diagnosis not present

## 2017-04-08 DIAGNOSIS — G43109 Migraine with aura, not intractable, without status migrainosus: Secondary | ICD-10-CM | POA: Diagnosis not present

## 2017-04-08 DIAGNOSIS — Z7982 Long term (current) use of aspirin: Secondary | ICD-10-CM | POA: Diagnosis not present

## 2017-04-08 DIAGNOSIS — F329 Major depressive disorder, single episode, unspecified: Secondary | ICD-10-CM | POA: Diagnosis not present

## 2017-04-08 DIAGNOSIS — F0280 Dementia in other diseases classified elsewhere without behavioral disturbance: Secondary | ICD-10-CM | POA: Diagnosis not present

## 2017-04-09 DIAGNOSIS — G43109 Migraine with aura, not intractable, without status migrainosus: Secondary | ICD-10-CM | POA: Diagnosis not present

## 2017-04-09 DIAGNOSIS — R569 Unspecified convulsions: Secondary | ICD-10-CM | POA: Diagnosis not present

## 2017-04-09 DIAGNOSIS — Z7982 Long term (current) use of aspirin: Secondary | ICD-10-CM | POA: Diagnosis not present

## 2017-04-09 DIAGNOSIS — F0280 Dementia in other diseases classified elsewhere without behavioral disturbance: Secondary | ICD-10-CM | POA: Diagnosis not present

## 2017-04-09 DIAGNOSIS — G301 Alzheimer's disease with late onset: Secondary | ICD-10-CM | POA: Diagnosis not present

## 2017-04-09 DIAGNOSIS — F329 Major depressive disorder, single episode, unspecified: Secondary | ICD-10-CM | POA: Diagnosis not present

## 2017-04-09 DIAGNOSIS — F419 Anxiety disorder, unspecified: Secondary | ICD-10-CM | POA: Diagnosis not present

## 2017-04-17 DIAGNOSIS — G301 Alzheimer's disease with late onset: Secondary | ICD-10-CM | POA: Diagnosis not present

## 2017-04-17 DIAGNOSIS — F329 Major depressive disorder, single episode, unspecified: Secondary | ICD-10-CM | POA: Diagnosis not present

## 2017-04-17 DIAGNOSIS — Z7982 Long term (current) use of aspirin: Secondary | ICD-10-CM | POA: Diagnosis not present

## 2017-04-17 DIAGNOSIS — F0280 Dementia in other diseases classified elsewhere without behavioral disturbance: Secondary | ICD-10-CM | POA: Diagnosis not present

## 2017-04-17 DIAGNOSIS — G43109 Migraine with aura, not intractable, without status migrainosus: Secondary | ICD-10-CM | POA: Diagnosis not present

## 2017-04-17 DIAGNOSIS — Z9181 History of falling: Secondary | ICD-10-CM | POA: Diagnosis not present

## 2017-04-17 DIAGNOSIS — R569 Unspecified convulsions: Secondary | ICD-10-CM | POA: Diagnosis not present

## 2017-04-17 DIAGNOSIS — F419 Anxiety disorder, unspecified: Secondary | ICD-10-CM | POA: Diagnosis not present

## 2017-04-24 DIAGNOSIS — R569 Unspecified convulsions: Secondary | ICD-10-CM | POA: Diagnosis not present

## 2017-04-24 DIAGNOSIS — F0280 Dementia in other diseases classified elsewhere without behavioral disturbance: Secondary | ICD-10-CM | POA: Diagnosis not present

## 2017-04-24 DIAGNOSIS — Z7982 Long term (current) use of aspirin: Secondary | ICD-10-CM | POA: Diagnosis not present

## 2017-04-24 DIAGNOSIS — Z9181 History of falling: Secondary | ICD-10-CM | POA: Diagnosis not present

## 2017-04-24 DIAGNOSIS — F419 Anxiety disorder, unspecified: Secondary | ICD-10-CM | POA: Diagnosis not present

## 2017-04-24 DIAGNOSIS — G43109 Migraine with aura, not intractable, without status migrainosus: Secondary | ICD-10-CM | POA: Diagnosis not present

## 2017-04-24 DIAGNOSIS — F329 Major depressive disorder, single episode, unspecified: Secondary | ICD-10-CM | POA: Diagnosis not present

## 2017-04-24 DIAGNOSIS — G301 Alzheimer's disease with late onset: Secondary | ICD-10-CM | POA: Diagnosis not present

## 2017-04-25 DIAGNOSIS — F0280 Dementia in other diseases classified elsewhere without behavioral disturbance: Secondary | ICD-10-CM | POA: Diagnosis not present

## 2017-04-25 DIAGNOSIS — F419 Anxiety disorder, unspecified: Secondary | ICD-10-CM | POA: Diagnosis not present

## 2017-04-25 DIAGNOSIS — G301 Alzheimer's disease with late onset: Secondary | ICD-10-CM | POA: Diagnosis not present

## 2017-04-25 DIAGNOSIS — F329 Major depressive disorder, single episode, unspecified: Secondary | ICD-10-CM | POA: Diagnosis not present

## 2017-04-25 DIAGNOSIS — Z7982 Long term (current) use of aspirin: Secondary | ICD-10-CM | POA: Diagnosis not present

## 2017-04-25 DIAGNOSIS — G43109 Migraine with aura, not intractable, without status migrainosus: Secondary | ICD-10-CM | POA: Diagnosis not present

## 2017-04-25 DIAGNOSIS — R569 Unspecified convulsions: Secondary | ICD-10-CM | POA: Diagnosis not present

## 2017-04-25 DIAGNOSIS — Z9181 History of falling: Secondary | ICD-10-CM | POA: Diagnosis not present

## 2017-04-29 DIAGNOSIS — F419 Anxiety disorder, unspecified: Secondary | ICD-10-CM | POA: Diagnosis not present

## 2017-04-29 DIAGNOSIS — F0280 Dementia in other diseases classified elsewhere without behavioral disturbance: Secondary | ICD-10-CM | POA: Diagnosis not present

## 2017-04-29 DIAGNOSIS — F329 Major depressive disorder, single episode, unspecified: Secondary | ICD-10-CM | POA: Diagnosis not present

## 2017-04-29 DIAGNOSIS — R569 Unspecified convulsions: Secondary | ICD-10-CM | POA: Diagnosis not present

## 2017-04-29 DIAGNOSIS — Z7982 Long term (current) use of aspirin: Secondary | ICD-10-CM | POA: Diagnosis not present

## 2017-04-29 DIAGNOSIS — G301 Alzheimer's disease with late onset: Secondary | ICD-10-CM | POA: Diagnosis not present

## 2017-04-29 DIAGNOSIS — Z9181 History of falling: Secondary | ICD-10-CM | POA: Diagnosis not present

## 2017-04-29 DIAGNOSIS — G43109 Migraine with aura, not intractable, without status migrainosus: Secondary | ICD-10-CM | POA: Diagnosis not present

## 2017-05-01 DIAGNOSIS — Z7982 Long term (current) use of aspirin: Secondary | ICD-10-CM | POA: Diagnosis not present

## 2017-05-01 DIAGNOSIS — F419 Anxiety disorder, unspecified: Secondary | ICD-10-CM | POA: Diagnosis not present

## 2017-05-01 DIAGNOSIS — F0280 Dementia in other diseases classified elsewhere without behavioral disturbance: Secondary | ICD-10-CM | POA: Diagnosis not present

## 2017-05-01 DIAGNOSIS — G301 Alzheimer's disease with late onset: Secondary | ICD-10-CM | POA: Diagnosis not present

## 2017-05-01 DIAGNOSIS — R569 Unspecified convulsions: Secondary | ICD-10-CM | POA: Diagnosis not present

## 2017-05-01 DIAGNOSIS — G43109 Migraine with aura, not intractable, without status migrainosus: Secondary | ICD-10-CM | POA: Diagnosis not present

## 2017-05-01 DIAGNOSIS — Z9181 History of falling: Secondary | ICD-10-CM | POA: Diagnosis not present

## 2017-05-01 DIAGNOSIS — F329 Major depressive disorder, single episode, unspecified: Secondary | ICD-10-CM | POA: Diagnosis not present

## 2017-05-06 ENCOUNTER — Telehealth: Payer: Self-pay | Admitting: Internal Medicine

## 2017-05-06 NOTE — Telephone Encounter (Signed)
SIGNED ORDERS FAXED TO [facility] AND PUT INTO THEIR FOLDER TO BE PICKED UP.JW

## 2017-05-07 DIAGNOSIS — G43109 Migraine with aura, not intractable, without status migrainosus: Secondary | ICD-10-CM | POA: Diagnosis not present

## 2017-05-07 DIAGNOSIS — G301 Alzheimer's disease with late onset: Secondary | ICD-10-CM | POA: Diagnosis not present

## 2017-05-07 DIAGNOSIS — F329 Major depressive disorder, single episode, unspecified: Secondary | ICD-10-CM | POA: Diagnosis not present

## 2017-05-07 DIAGNOSIS — Z7982 Long term (current) use of aspirin: Secondary | ICD-10-CM | POA: Diagnosis not present

## 2017-05-07 DIAGNOSIS — F0280 Dementia in other diseases classified elsewhere without behavioral disturbance: Secondary | ICD-10-CM | POA: Diagnosis not present

## 2017-05-07 DIAGNOSIS — F419 Anxiety disorder, unspecified: Secondary | ICD-10-CM | POA: Diagnosis not present

## 2017-05-07 DIAGNOSIS — Z9181 History of falling: Secondary | ICD-10-CM | POA: Diagnosis not present

## 2017-05-07 DIAGNOSIS — R569 Unspecified convulsions: Secondary | ICD-10-CM | POA: Diagnosis not present

## 2017-05-13 ENCOUNTER — Other Ambulatory Visit: Payer: Self-pay | Admitting: Internal Medicine

## 2017-05-14 DIAGNOSIS — Z9181 History of falling: Secondary | ICD-10-CM | POA: Diagnosis not present

## 2017-05-14 DIAGNOSIS — G43109 Migraine with aura, not intractable, without status migrainosus: Secondary | ICD-10-CM | POA: Diagnosis not present

## 2017-05-14 DIAGNOSIS — F329 Major depressive disorder, single episode, unspecified: Secondary | ICD-10-CM | POA: Diagnosis not present

## 2017-05-14 DIAGNOSIS — F0280 Dementia in other diseases classified elsewhere without behavioral disturbance: Secondary | ICD-10-CM | POA: Diagnosis not present

## 2017-05-14 DIAGNOSIS — G301 Alzheimer's disease with late onset: Secondary | ICD-10-CM | POA: Diagnosis not present

## 2017-05-14 DIAGNOSIS — R569 Unspecified convulsions: Secondary | ICD-10-CM | POA: Diagnosis not present

## 2017-05-14 DIAGNOSIS — Z7982 Long term (current) use of aspirin: Secondary | ICD-10-CM | POA: Diagnosis not present

## 2017-05-14 DIAGNOSIS — F419 Anxiety disorder, unspecified: Secondary | ICD-10-CM | POA: Diagnosis not present

## 2017-05-16 DIAGNOSIS — F419 Anxiety disorder, unspecified: Secondary | ICD-10-CM | POA: Diagnosis not present

## 2017-05-16 DIAGNOSIS — G301 Alzheimer's disease with late onset: Secondary | ICD-10-CM | POA: Diagnosis not present

## 2017-05-16 DIAGNOSIS — G43109 Migraine with aura, not intractable, without status migrainosus: Secondary | ICD-10-CM | POA: Diagnosis not present

## 2017-05-16 DIAGNOSIS — R569 Unspecified convulsions: Secondary | ICD-10-CM | POA: Diagnosis not present

## 2017-05-16 DIAGNOSIS — F329 Major depressive disorder, single episode, unspecified: Secondary | ICD-10-CM | POA: Diagnosis not present

## 2017-05-16 DIAGNOSIS — F0280 Dementia in other diseases classified elsewhere without behavioral disturbance: Secondary | ICD-10-CM | POA: Diagnosis not present

## 2017-05-16 DIAGNOSIS — Z7982 Long term (current) use of aspirin: Secondary | ICD-10-CM | POA: Diagnosis not present

## 2017-05-16 DIAGNOSIS — Z9181 History of falling: Secondary | ICD-10-CM | POA: Diagnosis not present

## 2017-05-19 ENCOUNTER — Telehealth: Payer: Self-pay | Admitting: Internal Medicine

## 2017-05-19 NOTE — Telephone Encounter (Signed)
SIGNED PLAN OF CARE PUT INTO [facility] FOLDER TO BE PICKED UP.JW °

## 2017-05-27 ENCOUNTER — Ambulatory Visit (INDEPENDENT_AMBULATORY_CARE_PROVIDER_SITE_OTHER): Payer: Medicare HMO | Admitting: Nurse Practitioner

## 2017-05-27 ENCOUNTER — Encounter: Payer: Self-pay | Admitting: Nurse Practitioner

## 2017-05-27 VITALS — BP 159/84 | HR 58 | Resp 16 | Ht 65.0 in | Wt 128.4 lb

## 2017-05-27 DIAGNOSIS — M810 Age-related osteoporosis without current pathological fracture: Secondary | ICD-10-CM | POA: Diagnosis not present

## 2017-05-27 DIAGNOSIS — Z1239 Encounter for other screening for malignant neoplasm of breast: Secondary | ICD-10-CM

## 2017-05-27 DIAGNOSIS — E782 Mixed hyperlipidemia: Secondary | ICD-10-CM | POA: Diagnosis not present

## 2017-05-27 DIAGNOSIS — F0281 Dementia in other diseases classified elsewhere with behavioral disturbance: Secondary | ICD-10-CM | POA: Diagnosis not present

## 2017-05-27 DIAGNOSIS — G309 Alzheimer's disease, unspecified: Secondary | ICD-10-CM

## 2017-05-27 DIAGNOSIS — Z1231 Encounter for screening mammogram for malignant neoplasm of breast: Secondary | ICD-10-CM | POA: Diagnosis not present

## 2017-05-27 DIAGNOSIS — F419 Anxiety disorder, unspecified: Secondary | ICD-10-CM

## 2017-05-27 NOTE — Progress Notes (Signed)
[facility] [place], [place] [address]  Internal MEDICINE  Office Visit Note  Patient Name: Joan Peters  [id number]  [id number]  Date of Service: 06/15/2017  Chief Complaint  Patient presents with  . Dementia    The patient is here for routine follow up exam. She is in the office with her husband. They do report increased dementia. Patient is no longer driving and is seeing neurology on regular basis.     Pt is here for routine follow up.    Current Medication: Outpatient Encounter Medications as of 05/27/2017  Medication Sig  . alendronate (FOSAMAX) 70 MG tablet TAKE 1 TABLET ONE TIME WEEKLY  . aspirin 81 MG chewable tablet Chew by mouth.  . citalopram (CELEXA) 20 MG tablet 10 mg.  . donepezil (ARICEPT) 10 MG tablet Take by mouth.  . meloxicam (MOBIC) 7.5 MG tablet   . Omega-3 Fatty Acids (FISH OIL) 1000 MG CAPS Take by mouth.  . QUEtiapine (SEROQUEL) 25 MG tablet Take by mouth.  . simvastatin (ZOCOR) 40 MG tablet Take by mouth.   No facility-administered encounter medications on file as of 05/27/2017.     Surgical History: Past Surgical History:  Procedure Laterality Date  . ABDOMINAL HYSTERECTOMY    . CATARACT EXTRACTION    . CESAREAN SECTION    . right shoulder surgery      Medical History: Past Medical History:  Diagnosis Date  . Allergy   . Alzheimer disease   . Anxiety   . Dementia   . Depression   . Hyperlipidemia   . Migraines   . Petit mal seizure status ([place])     Family History: No family history on file.  Social History   Socioeconomic History  . Marital status: Married    Spouse name: Not on file  . Number of children: Not on file  . Years of education: Not on file  . Highest education level: Not on file  Social Needs  . Financial resource strain: Not on file  . Food insecurity - worry: Not on file  . Food insecurity - inability: Not on file  . Transportation needs - medical: Not on file  .  Transportation needs - non-medical: Not on file  Occupational History  . Not on file  Tobacco Use  . Smoking status: Never Smoker  . Smokeless tobacco: Never Used  Substance and Sexual Activity  . Alcohol use: Yes  . Drug use: No  . Sexual activity: Not on file  Other Topics Concern  . Not on file  Social History Narrative  . Not on file      Review of Systems  Constitutional: Positive for activity change. Negative for chills, fatigue and unexpected weight change.  HENT: Negative for congestion, postnasal drip, rhinorrhea, sneezing and sore throat.   Eyes: Negative.  Negative for redness.  Respiratory: Negative for cough, chest tightness, shortness of breath and wheezing.   Cardiovascular: Negative for chest pain and palpitations.  Gastrointestinal: Negative for abdominal distention, abdominal pain, constipation, diarrhea, nausea and vomiting.  Endocrine:       Well controlled hypothyroid   Genitourinary: Negative.  Negative for dysuria and frequency.  Musculoskeletal: Negative for arthralgias, back pain, joint swelling and neck pain.  Skin: Negative for rash.  Allergic/Immunologic: Negative for environmental allergies.  Neurological: Positive for headaches. Negative for tremors and numbness.       Progressive memory loss.   Hematological: Negative for adenopathy. Does not bruise/bleed easily.  Psychiatric/Behavioral:  Negative for behavioral problems (Depression), sleep disturbance and suicidal ideas. The patient is nervous/anxious.     Today's Vitals   05/27/17 1527  BP: (!) 159/84  Pulse: (!) 58  Resp: 16  SpO2: 98%  Weight: 128 lb 6.4 oz (58.2 kg)  Height: 5\' 5"  (1.651 m)     Physical Exam  Constitutional: She is oriented to person, place, and time. She appears well-developed and well-nourished. No distress.  HENT:  Head: Normocephalic and atraumatic.  Mouth/Throat: Oropharynx is clear and moist. No oropharyngeal exudate.  Eyes: EOM are normal. Pupils are  equal, round, and reactive to light.  Neck: Normal range of motion. Neck supple. No JVD present. Carotid bruit is not present. No tracheal deviation present. No thyromegaly present.  Cardiovascular: Normal rate, regular rhythm, normal heart sounds and intact distal pulses. Exam reveals no gallop and no friction rub.  No murmur heard. Pulmonary/Chest: Effort normal and breath sounds normal. No respiratory distress. She has no wheezes. She has no rales. She exhibits no tenderness.  Abdominal: Soft. Bowel sounds are normal. There is no tenderness.  Musculoskeletal: Normal range of motion.  Lymphadenopathy:    She has no cervical adenopathy.  Neurological: She is alert and oriented to person, place, and time. No cranial nerve deficit.  The patient is at her neurological baseline.   Skin: Skin is warm and dry. She is not diaphoretic.  Psychiatric: Her speech is normal and behavior is normal. Judgment and thought content normal. Her mood appears anxious. Cognition and memory are normal. She exhibits a depressed mood.  Nursing note and vitals reviewed.  Assessment/Plan: 1. Mixed hyperlipidemia Stable. Continue statin as prescribed.   2. Alzheimer's dementia with behavioral disturbance, unspecified timing of dementia onset Continue donepazil as prescribed. Regular visits with neurology as scheduled.   3. Anxiety Continue quietaprine as needed and as prescribed.   4. Osteoporosis of multiple sites Stable. Continue fosamax 75mg  weekly.   5. Screening for breast cancer - MM DIGITAL SCREENING BILATERAL; Future  General Counseling: Pricilla verbalizes understanding of the findings of todays visit and agrees with plan of treatment. I have discussed any further diagnostic evaluation that may be needed or ordered today. We also reviewed her medications today. she has been encouraged to call the office with any questions or concerns that should arise related to todays visit.   This patient was seen by  Leretha Pol, FNP- C in Collaboration with Dr Lavera Guise as a part of collaborative care agreement    Orders Placed This Encounter  Procedures  . MM DIGITAL SCREENING BILATERAL      Time spent: 67 Minutes     Dr Lavera Guise Internal medicine

## 2017-05-28 DIAGNOSIS — G43109 Migraine with aura, not intractable, without status migrainosus: Secondary | ICD-10-CM | POA: Diagnosis not present

## 2017-05-28 DIAGNOSIS — Z7982 Long term (current) use of aspirin: Secondary | ICD-10-CM | POA: Diagnosis not present

## 2017-05-28 DIAGNOSIS — Z9181 History of falling: Secondary | ICD-10-CM | POA: Diagnosis not present

## 2017-05-28 DIAGNOSIS — R569 Unspecified convulsions: Secondary | ICD-10-CM | POA: Diagnosis not present

## 2017-05-28 DIAGNOSIS — F419 Anxiety disorder, unspecified: Secondary | ICD-10-CM | POA: Diagnosis not present

## 2017-05-28 DIAGNOSIS — G301 Alzheimer's disease with late onset: Secondary | ICD-10-CM | POA: Diagnosis not present

## 2017-05-28 DIAGNOSIS — F0280 Dementia in other diseases classified elsewhere without behavioral disturbance: Secondary | ICD-10-CM | POA: Diagnosis not present

## 2017-05-28 DIAGNOSIS — F329 Major depressive disorder, single episode, unspecified: Secondary | ICD-10-CM | POA: Diagnosis not present

## 2017-06-03 DIAGNOSIS — F0280 Dementia in other diseases classified elsewhere without behavioral disturbance: Secondary | ICD-10-CM | POA: Diagnosis not present

## 2017-06-03 DIAGNOSIS — G301 Alzheimer's disease with late onset: Secondary | ICD-10-CM | POA: Diagnosis not present

## 2017-06-03 DIAGNOSIS — G43109 Migraine with aura, not intractable, without status migrainosus: Secondary | ICD-10-CM | POA: Diagnosis not present

## 2017-06-03 DIAGNOSIS — Z9181 History of falling: Secondary | ICD-10-CM | POA: Diagnosis not present

## 2017-06-03 DIAGNOSIS — F419 Anxiety disorder, unspecified: Secondary | ICD-10-CM | POA: Diagnosis not present

## 2017-06-03 DIAGNOSIS — F329 Major depressive disorder, single episode, unspecified: Secondary | ICD-10-CM | POA: Diagnosis not present

## 2017-06-03 DIAGNOSIS — R569 Unspecified convulsions: Secondary | ICD-10-CM | POA: Diagnosis not present

## 2017-06-03 DIAGNOSIS — Z7982 Long term (current) use of aspirin: Secondary | ICD-10-CM | POA: Diagnosis not present

## 2017-06-04 DIAGNOSIS — F329 Major depressive disorder, single episode, unspecified: Secondary | ICD-10-CM | POA: Diagnosis not present

## 2017-06-04 DIAGNOSIS — Z9181 History of falling: Secondary | ICD-10-CM | POA: Diagnosis not present

## 2017-06-04 DIAGNOSIS — G43109 Migraine with aura, not intractable, without status migrainosus: Secondary | ICD-10-CM | POA: Diagnosis not present

## 2017-06-04 DIAGNOSIS — R569 Unspecified convulsions: Secondary | ICD-10-CM | POA: Diagnosis not present

## 2017-06-04 DIAGNOSIS — G301 Alzheimer's disease with late onset: Secondary | ICD-10-CM | POA: Diagnosis not present

## 2017-06-04 DIAGNOSIS — F0280 Dementia in other diseases classified elsewhere without behavioral disturbance: Secondary | ICD-10-CM | POA: Diagnosis not present

## 2017-06-04 DIAGNOSIS — Z7982 Long term (current) use of aspirin: Secondary | ICD-10-CM | POA: Diagnosis not present

## 2017-06-04 DIAGNOSIS — F419 Anxiety disorder, unspecified: Secondary | ICD-10-CM | POA: Diagnosis not present

## 2017-06-06 ENCOUNTER — Telehealth: Payer: Self-pay

## 2017-06-06 DIAGNOSIS — L821 Other seborrheic keratosis: Secondary | ICD-10-CM | POA: Diagnosis not present

## 2017-06-06 DIAGNOSIS — Z85828 Personal history of other malignant neoplasm of skin: Secondary | ICD-10-CM | POA: Diagnosis not present

## 2017-06-06 DIAGNOSIS — D225 Melanocytic nevi of trunk: Secondary | ICD-10-CM | POA: Diagnosis not present

## 2017-06-06 DIAGNOSIS — D2262 Melanocytic nevi of left upper limb, including shoulder: Secondary | ICD-10-CM | POA: Diagnosis not present

## 2017-06-06 NOTE — Telephone Encounter (Signed)
Di Kindle from [facility] called requesting speech therapy for pt for 1 x wk for every other week for 3 wks.  Gave approval to go ahead.  dbs

## 2017-06-09 DIAGNOSIS — Z7982 Long term (current) use of aspirin: Secondary | ICD-10-CM | POA: Diagnosis not present

## 2017-06-09 DIAGNOSIS — G301 Alzheimer's disease with late onset: Secondary | ICD-10-CM | POA: Diagnosis not present

## 2017-06-09 DIAGNOSIS — R569 Unspecified convulsions: Secondary | ICD-10-CM | POA: Diagnosis not present

## 2017-06-09 DIAGNOSIS — F0280 Dementia in other diseases classified elsewhere without behavioral disturbance: Secondary | ICD-10-CM | POA: Diagnosis not present

## 2017-06-09 DIAGNOSIS — F329 Major depressive disorder, single episode, unspecified: Secondary | ICD-10-CM | POA: Diagnosis not present

## 2017-06-09 DIAGNOSIS — G43109 Migraine with aura, not intractable, without status migrainosus: Secondary | ICD-10-CM | POA: Diagnosis not present

## 2017-06-09 DIAGNOSIS — F419 Anxiety disorder, unspecified: Secondary | ICD-10-CM | POA: Diagnosis not present

## 2017-06-09 DIAGNOSIS — Z9181 History of falling: Secondary | ICD-10-CM | POA: Diagnosis not present

## 2017-06-10 DIAGNOSIS — Z9181 History of falling: Secondary | ICD-10-CM | POA: Diagnosis not present

## 2017-06-10 DIAGNOSIS — G301 Alzheimer's disease with late onset: Secondary | ICD-10-CM | POA: Diagnosis not present

## 2017-06-10 DIAGNOSIS — F0280 Dementia in other diseases classified elsewhere without behavioral disturbance: Secondary | ICD-10-CM | POA: Diagnosis not present

## 2017-06-10 DIAGNOSIS — G43109 Migraine with aura, not intractable, without status migrainosus: Secondary | ICD-10-CM | POA: Diagnosis not present

## 2017-06-10 DIAGNOSIS — F329 Major depressive disorder, single episode, unspecified: Secondary | ICD-10-CM | POA: Diagnosis not present

## 2017-06-10 DIAGNOSIS — F419 Anxiety disorder, unspecified: Secondary | ICD-10-CM | POA: Diagnosis not present

## 2017-06-10 DIAGNOSIS — Z7982 Long term (current) use of aspirin: Secondary | ICD-10-CM | POA: Diagnosis not present

## 2017-06-10 DIAGNOSIS — R569 Unspecified convulsions: Secondary | ICD-10-CM | POA: Diagnosis not present

## 2017-06-15 DIAGNOSIS — G309 Alzheimer's disease, unspecified: Secondary | ICD-10-CM

## 2017-06-15 DIAGNOSIS — F0280 Dementia in other diseases classified elsewhere without behavioral disturbance: Secondary | ICD-10-CM | POA: Insufficient documentation

## 2017-06-15 DIAGNOSIS — M810 Age-related osteoporosis without current pathological fracture: Secondary | ICD-10-CM | POA: Insufficient documentation

## 2017-06-15 DIAGNOSIS — E782 Mixed hyperlipidemia: Secondary | ICD-10-CM | POA: Insufficient documentation

## 2017-06-15 DIAGNOSIS — F419 Anxiety disorder, unspecified: Secondary | ICD-10-CM | POA: Insufficient documentation

## 2017-07-29 ENCOUNTER — Emergency Department
Admission: EM | Admit: 2017-07-29 | Discharge: 2017-07-29 | Disposition: A | Discharge status: Home / Self Care | Payer: Medicare HMO | Attending: Emergency Medicine | Admitting: Emergency Medicine

## 2017-07-29 ENCOUNTER — Other Ambulatory Visit: Payer: Self-pay

## 2017-07-29 ENCOUNTER — Encounter: Payer: Self-pay | Admitting: Emergency Medicine

## 2017-07-29 ENCOUNTER — Emergency Department: Payer: Medicare HMO

## 2017-07-29 DIAGNOSIS — Z23 Encounter for immunization: Secondary | ICD-10-CM | POA: Insufficient documentation

## 2017-07-29 DIAGNOSIS — Z79899 Other long term (current) drug therapy: Secondary | ICD-10-CM | POA: Diagnosis not present

## 2017-07-29 DIAGNOSIS — S0993XA Unspecified injury of face, initial encounter: Secondary | ICD-10-CM | POA: Diagnosis not present

## 2017-07-29 DIAGNOSIS — G309 Alzheimer's disease, unspecified: Secondary | ICD-10-CM | POA: Insufficient documentation

## 2017-07-29 DIAGNOSIS — W19XXXA Unspecified fall, initial encounter: Secondary | ICD-10-CM

## 2017-07-29 DIAGNOSIS — R040 Epistaxis: Secondary | ICD-10-CM | POA: Insufficient documentation

## 2017-07-29 DIAGNOSIS — R413 Other amnesia: Secondary | ICD-10-CM | POA: Diagnosis not present

## 2017-07-29 DIAGNOSIS — Z7982 Long term (current) use of aspirin: Secondary | ICD-10-CM | POA: Insufficient documentation

## 2017-07-29 MED ORDER — TETANUS-DIPHTH-ACELL PERTUSSIS 5-2.5-18.5 LF-MCG/0.5 IM SUSP
0.5000 mL | Freq: Once | INTRAMUSCULAR | Status: AC
  Administered 2017-07-29: 0.5 mL via INTRAMUSCULAR
  Filled 2017-07-29: qty 0.5

## 2017-07-29 MED ORDER — OXYMETAZOLINE HCL 0.05 % NA SOLN
1.0000 | Freq: Once | NASAL | Status: AC
  Administered 2017-07-29: 1 via NASAL
  Filled 2017-07-29: qty 15

## 2017-07-29 NOTE — ED Provider Notes (Signed)
[facility] Emergency Department Provider Note  Time seen: 5:00 PM  I have reviewed the triage vital signs and the nursing notes.   HISTORY  Chief Complaint Fall and Epistaxis    HPI Joan Peters is a 72 y.o. female with a past medical history of Alzheimer's dementia, hyperlipidemia, depression, presents to the emergency department after fall.  According to EMS report patient was at home with family.  Family states that he briefly turned around and when they looked back the patient had fallen forwards onto the ground and was bleeding from her nose and possibly upper lip.  Denies any loss of consciousness.  Here the patient is calm, cooperative and pleasant.  She is confused which is likely baseline but does not recall the event does not recall the fall does not know why she is here.  Patient cannot contribute to her history.  Has a very minimal ooze from her right nostril currently.   Past Medical History:  Diagnosis Date  . Allergy   . Alzheimer disease   . Anxiety   . Dementia   . Depression   . Hyperlipidemia   . Migraines   . Petit mal seizure status [facility])     Patient Active Problem List   Diagnosis Date Noted  . Hyperlipidemia 06/15/2017  . Anxiety 06/15/2017  . Alzheimer disease 06/15/2017  . Osteoporosis of multiple sites 06/15/2017    Past Surgical History:  Procedure Laterality Date  . ABDOMINAL HYSTERECTOMY    . CATARACT EXTRACTION    . CESAREAN SECTION    . right shoulder surgery      Prior to Admission medications   Medication Sig Start Date End Date Taking? Authorizing Provider  alendronate (FOSAMAX) 70 MG tablet TAKE 1 TABLET ONE TIME WEEKLY 05/13/17   Ronnell Freshwater, NP  aspirin 81 MG chewable tablet Chew by mouth.    [provider]  citalopram (CELEXA) 20 MG tablet 10 mg. 02/03/14   [provider]  donepezil (ARICEPT) 10 MG tablet Take by mouth. 11/20/16   [provider]  meloxicam (MOBIC)  7.5 MG tablet  11/27/16   [provider]  Omega-3 Fatty Acids (FISH OIL) 1000 MG CAPS Take by mouth.    [provider]  QUEtiapine (SEROQUEL) 25 MG tablet Take by mouth. 03/07/17 09/03/17  [provider]  simvastatin (ZOCOR) 40 MG tablet Take by mouth. 03/30/14   [provider]    Allergies  Allergen Reactions  . Sulfa Antibiotics Itching    No family history on file.  Social History Social History   Tobacco Use  . Smoking status: Never Smoker  . Smokeless tobacco: Never Used  Substance Use Topics  . Alcohol use: Yes  . Drug use: No    Review of Systems Unable to obtain an adequate/accurate review of systems secondary to dementia  ____________________________________________   PHYSICAL EXAM:  VITAL SIGNS: ED Triage Vitals  Enc Vitals Group     BP 07/29/17 1656 133/83     Pulse Rate 07/29/17 1656 70     Resp 07/29/17 1656 16     Temp 07/29/17 1656 (!) 97.2 F (36.2 C)     Temp Source 07/29/17 1656 Oral     SpO2 07/29/17 1656 96 %     Weight 07/29/17 1657 128 lb (58.1 kg)     Height 07/29/17 1657 5\' 4"  (1.626 m)     Head Circumference --      Peak Flow --  Pain Score --      Pain Loc --      Pain Edu? --      Excl. in [place]? --    Constitutional: Alert, well-appearing, no distress. Eyes: Normal exam ENT   Head: Mild swelling along the nasal bridge upper and lower lip with abrasion to the inside of the upper lip.  Mild bleeding from nose.   Nose: Blood present in both nostrils, can clearly see the septum with no signs of septal hematoma.  Very minimal bleed from right nostril currently.   Mouth/Throat: Mucous membranes are moist.  No dental injuries identified.  Abrasion to upper lip with mild swelling to upper lip.  Mild swelling to lower lip but no abrasion or laceration. Cardiovascular: Normal rate, regular rhythm. No murmur Respiratory: Normal respiratory effort without tachypnea nor retractions. Breath sounds  are clear  Gastrointestinal: Soft and nontender. No distention. Musculoskeletal: Nontender with normal range of motion in all extremities.  No C-spine tenderness. Neurologic:  Normal speech and language. No gross focal neurologic deficits Skin:  Skin is warm.  Abrasion to upper lip  Psychiatric: Mood and affect are normal.   ____________________________________________    EKG  EKG reviewed and interpreted by myself shows normal sinus rhythm at 71 bpm with a narrow QRS, normal axis, normal intervals, no concerning ST changes.  ____________________________________________   INITIAL IMPRESSION / ASSESSMENT AND PLAN / ED COURSE  Pertinent labs & imaging results that were available during my care of the patient were reviewed by me and considered in my medical decision making (see chart for details).  Patient presents to the emergency department after a fall.  Patient has dementia and cannot contribute to much to her history.  Husband is now here he states the fall was on grass, possibly with rocks.  Does not believe the patient passed out.  Given her facial injuries will obtain CT imaging of the head and face.  Patient has no C-spine tenderness.  Unknown last tetanus shot we will update tetanus.  Unable to complete CT imaging due to patient cooperation.  She has become somewhat agitated refusing to lay down for CT scan saying she does not need them.  Husband is here, after speaking to the husband he states he would prefer to avoid the CT imaging does not feel we need to sedate the patient to get it done I believe this is reasonable and rational as the patient appears well otherwise.  States her agitation has increased recently, they had blood work and urinalysis performed today the primary care doctor, I reviewed these and they were fairly normal.  No sign of urinary tract infection.  Husband wishes to take the patient home.  They are talking to the doctor about doubling her Seroquel from 25 mg  twice daily to 50 mg twice daily.  I believe this is a reasonable plan of care as well and the husband will initiate this tonight.  They will follow-up with their doctor. ____________________________________________   FINAL CLINICAL IMPRESSION(S) / ED DIAGNOSES  Fall Facial injury    Harvest Dark, MD 07/29/17 [id number]

## 2017-07-29 NOTE — ED Notes (Signed)
Pt ambulatory upon discharge to wheelchair. Pt's husband verbalized understanding of discharge instructions, follow-up care, pain management and understands the importance of getting CT scans to clear pt from bleeding in her head. Husband insists to this RN and to EDP that he understands the risks but just wants to take his wife home. Pt's VSS. Skin warm and dry. At baseline cognition, per husband.   This RN, Amy, RN and Crystal, EDT helped pt into wheelchair and into car upon discharge.   Husband e-signed pt's discharge.

## 2017-07-29 NOTE — ED Triage Notes (Signed)
Ems from home.  Was walking outside with family in attendance and she fell.  Has blood on clothing from nose bleed.  Upper lip is swollen.  Patient has alzheimers per ems.

## 2017-07-29 NOTE — ED Notes (Signed)
CT attempted to take pt for scans. Pt refused scans, stating she didn't need them. Pt has hx Alzheimer's. MD aware. MD to talk to husband re: next step.

## 2017-08-06 ENCOUNTER — Emergency Department: Payer: Medicare HMO

## 2017-08-06 ENCOUNTER — Encounter: Payer: Self-pay | Admitting: Emergency Medicine

## 2017-08-06 ENCOUNTER — Emergency Department
Admission: EM | Admit: 2017-08-06 | Discharge: 2017-08-06 | Disposition: A | Discharge status: Home / Self Care | Payer: Medicare HMO | Attending: Student in an Organized Health Care Education/Training Program | Admitting: Student in an Organized Health Care Education/Training Program

## 2017-08-06 DIAGNOSIS — G309 Alzheimer's disease, unspecified: Secondary | ICD-10-CM | POA: Diagnosis not present

## 2017-08-06 DIAGNOSIS — G43109 Migraine with aura, not intractable, without status migrainosus: Secondary | ICD-10-CM | POA: Diagnosis not present

## 2017-08-06 DIAGNOSIS — Z79899 Other long term (current) drug therapy: Secondary | ICD-10-CM | POA: Diagnosis not present

## 2017-08-06 DIAGNOSIS — R040 Epistaxis: Secondary | ICD-10-CM | POA: Diagnosis not present

## 2017-08-06 DIAGNOSIS — F0281 Dementia in other diseases classified elsewhere with behavioral disturbance: Secondary | ICD-10-CM | POA: Diagnosis not present

## 2017-08-06 DIAGNOSIS — Z7982 Long term (current) use of aspirin: Secondary | ICD-10-CM | POA: Diagnosis not present

## 2017-08-06 DIAGNOSIS — G301 Alzheimer's disease with late onset: Secondary | ICD-10-CM | POA: Diagnosis not present

## 2017-08-06 DIAGNOSIS — S0990XA Unspecified injury of head, initial encounter: Secondary | ICD-10-CM | POA: Diagnosis not present

## 2017-08-06 DIAGNOSIS — S0083XA Contusion of other part of head, initial encounter: Secondary | ICD-10-CM | POA: Diagnosis not present

## 2017-08-06 DIAGNOSIS — F418 Other specified anxiety disorders: Secondary | ICD-10-CM | POA: Diagnosis not present

## 2017-08-06 DIAGNOSIS — R569 Unspecified convulsions: Secondary | ICD-10-CM | POA: Diagnosis not present

## 2017-08-06 DIAGNOSIS — Z87828 Personal history of other (healed) physical injury and trauma: Secondary | ICD-10-CM | POA: Diagnosis not present

## 2017-08-06 MED ORDER — LIDOCAINE-EPINEPHRINE (PF) 1 %-1:200000 IJ SOLN
20.0000 mL | Freq: Once | INTRAMUSCULAR | Status: AC
  Administered 2017-08-06: 20 mL via INTRADERMAL

## 2017-08-06 MED ORDER — LIDOCAINE HCL URETHRAL/MUCOSAL 2 % EX GEL
1.0000 "application " | Freq: Once | CUTANEOUS | Status: AC
  Administered 2017-08-06: 1 via TOPICAL
  Filled 2017-08-06: qty 10

## 2017-08-06 MED ORDER — LIDOCAINE-EPINEPHRINE (PF) 1 %-1:200000 IJ SOLN
INTRAMUSCULAR | Status: AC
  Administered 2017-08-06: 20 mL via INTRADERMAL
  Filled 2017-08-06: qty 30

## 2017-08-06 MED ORDER — AMOXICILLIN-POT CLAVULANATE 875-125 MG PO TABS: 1.0000 | ORAL_TABLET | Freq: Two times a day (BID) | ORAL | 0 refills | Status: AC

## 2017-08-06 MED ORDER — OXYMETAZOLINE HCL 0.05 % NA SOLN
1.0000 | Freq: Once | NASAL | Status: AC
  Administered 2017-08-06: 1 via NASAL
  Filled 2017-08-06: qty 15

## 2017-08-06 MED ORDER — LIDOCAINE-EPINEPHRINE (PF) 2 %-1:200000 IJ SOLN
20.0000 mL | Freq: Once | INTRAMUSCULAR | Status: DC
  Filled 2017-08-06: qty 20

## 2017-08-06 NOTE — ED Provider Notes (Signed)
[facility] Emergency Department Provider Note    First MD Initiated Contact with Patient 08/06/17 2119     (approximate)  I have reviewed the triage vital signs and the nursing notes.   HISTORY  Chief Complaint Epistaxis  Level V Caveat:  dementia  HPI Joan Peters is a 72 y.o. female the history of Alzheimer's dementia on baby aspirin presents to the ER after seeing neurology clinic today.  Patient had a fall over week ago.  Neurology saw patient today and was worried for head bleed and is told family that should be evaluated for repeat CT.  No new focal neuro deficits.  Has been having issues with recurrent nosebleeds primarily from the left nare since then.  No history of nosebleeds.  Other than the fall no other trauma.  Says it bleed will happen roughly every other day but today was more severe.  Past Medical History:  Diagnosis Date  . Allergy   . Alzheimer disease   . Anxiety   . Dementia   . Depression   . Hyperlipidemia   . Migraines   . Petit mal seizure status ([place])    No family history on file. Past Surgical History:  Procedure Laterality Date  . ABDOMINAL HYSTERECTOMY    . CATARACT EXTRACTION    . CESAREAN SECTION    . right shoulder surgery     Patient Active Problem List   Diagnosis Date Noted  . Hyperlipidemia 06/15/2017  . Anxiety 06/15/2017  . Alzheimer disease 06/15/2017  . Osteoporosis of multiple sites 06/15/2017      Prior to Admission medications   Medication Sig Start Date End Date Taking? Authorizing Provider  alendronate (FOSAMAX) 70 MG tablet TAKE 1 TABLET ONE TIME WEEKLY 05/13/17   Ronnell Freshwater, NP  amoxicillin-clavulanate (AUGMENTIN) 875-125 MG tablet Take 1 tablet by mouth 2 (two) times daily for 7 days. 08/06/17 08/13/17  Merlyn Lot, MD  aspirin 81 MG chewable tablet Chew by mouth.    [provider]  citalopram (CELEXA) 20 MG tablet 10 mg. 02/03/14   [provider]    donepezil (ARICEPT) 10 MG tablet Take by mouth. 11/20/16   [provider]  meloxicam (MOBIC) 7.5 MG tablet  11/27/16   [provider]  Omega-3 Fatty Acids (FISH OIL) 1000 MG CAPS Take by mouth.    [provider]  QUEtiapine (SEROQUEL) 25 MG tablet Take by mouth. 03/07/17 09/03/17  [provider]  simvastatin (ZOCOR) 40 MG tablet Take by mouth. 03/30/14   [provider]    Allergies Sulfa antibiotics    Social History Social History   Tobacco Use  . Smoking status: Never Smoker  . Smokeless tobacco: Never Used  Substance Use Topics  . Alcohol use: Yes  . Drug use: No    Review of Systems Patient denies headaches, rhinorrhea, blurry vision, numbness, shortness of breath, chest pain, edema, cough, abdominal pain, nausea, vomiting, diarrhea, dysuria, fevers, rashes or hallucinations unless otherwise stated above in HPI. ____________________________________________   PHYSICAL EXAM:  VITAL SIGNS: Vitals:   08/06/17 1823 08/06/17 2225  BP: (!) 177/80 (!) 169/81  Pulse: 63 89  Resp: 18 17  Temp: 98.2 F (36.8 C)   SpO2: 97% 98%    Constitutional: Alert and in no acute distress. Eyes: Conjunctivae are normal.  Head: Atraumatic. Nose: no obvious source of bleed from either anterior nares,  Bilateral clots in nare Mouth/Throat: Mucous membranes are moist.  No posterior hemorrhage  Neck: No stridor. Painless ROM.  Cardiovascular: Normal rate, regular rhythm. Grossly normal heart sounds.  Good peripheral circulation. Respiratory: Normal respiratory effort.  No retractions. Lungs CTAB. Gastrointestinal: Soft and nontender. No distention. No abdominal bruits. No CVA tenderness. Genitourinary:  Musculoskeletal: No lower extremity tenderness nor edema.  No joint effusions. Neurologic:  No gross focal neurologic deficits are appreciated. No facial droop Skin:  Skin is warm, dry and intact. No rash  noted.   ____________________________________________   LABS (all labs ordered are listed, but only abnormal results are displayed)  No results found for this or any previous visit (from the past 24 hour(s)). ____________________________________________  ____________________________________________  [id number]  I personally reviewed all radiographic images ordered to evaluate for the above acute complaints and reviewed radiology reports and findings.  These findings were personally discussed with the patient.  Please see medical record for radiology report.  ____________________________________________   PROCEDURES  Procedure(s) performed:  .Epistaxis Management Date/Time: 08/06/2017 10:34 PM Performed by: Merlyn Lot, MD Authorized by: Merlyn Lot, MD   Consent:    Consent obtained:  Verbal   Consent given by:  Patient   Risks discussed:  Infection, nasal injury, pain and bleeding   Alternatives discussed:  No treatment and delayed treatment Anesthesia (see MAR for exact dosages):    Anesthesia method:  Topical application   Topical anesthetic:  Lidocaine gel Procedure details:    Treatment site:  L anterior   Treatment method:  Merocel sponge   Treatment complexity:  Limited   Treatment episode: initial   Post-procedure details:    Assessment:  Bleeding stopped   Patient tolerance of procedure:  Tolerated well, no immediate complications      Critical Care performed: no ____________________________________________   INITIAL IMPRESSION / ASSESSMENT AND PLAN / ED COURSE  Pertinent labs & imaging results that were available during my care of the patient were reviewed by me and considered in my medical decision making (see chart for details).  DDX: SDH, IPH, SAH, concussion, fracture, anterior epistaxis, posterior epistaxis  Joan Peters is a 72 y.o. who presents to the ED with epistaxis as described above.  CT imaging ordered for the above  differential shows no evidence of acute intracranial abnormality.  No evidence of fracture.  Based on recurrence of her bleed nasal packing performed as described above.  Patient tolerated well.  Will be started on oral antibiotics and given referral to outpatient follow-up with ENT.      As part of my medical decision making, I reviewed the following data within the [facility] notes reviewed and incorporated, Labs reviewed, notes from prior ED visits and [place] Controlled Substance Database   ____________________________________________   FINAL CLINICAL IMPRESSION(S) / ED DIAGNOSES  Final diagnoses:  Anterior epistaxis      NEW MEDICATIONS STARTED DURING THIS VISIT:  Discharge Medication List as of 08/06/2017 10:19 PM    START taking these medications   Details  amoxicillin-clavulanate (AUGMENTIN) 875-125 MG tablet Take 1 tablet by mouth 2 (two) times daily for 7 days., Starting Wed 08/06/2017, Until Wed 08/13/2017, Print         Note:  This document was prepared using Dragon voice recognition software and may include unintentional dictation errors.    Merlyn Lot, MD 08/06/17 2235

## 2017-08-06 NOTE — ED Triage Notes (Signed)
Patient has bruising to left side of chin from fall.

## 2017-08-06 NOTE — ED Triage Notes (Signed)
Patient presents to the ED with nosebleed intermittently over the past week.  Patient was seen in the ED about 1 week ago post fall.  Patient was unable to get a CT scan last week because she has alzheimer's and she was uncooperative.  Patient saw the neurologist today and neurologist was concerned regarding possible head bleed.

## 2017-08-08 ENCOUNTER — Telehealth: Payer: Self-pay

## 2017-08-08 ENCOUNTER — Other Ambulatory Visit: Payer: Self-pay | Admitting: Nurse Practitioner

## 2017-08-08 DIAGNOSIS — Z87828 Personal history of other (healed) physical injury and trauma: Secondary | ICD-10-CM

## 2017-08-08 NOTE — Telephone Encounter (Signed)
NP from [facility] clinic called about FL2 form filled out for Joan Peters I advised pt husband and elizabeth oma  When her husband if going to nursing home or facility we can fill the form

## 2017-08-11 DIAGNOSIS — R040 Epistaxis: Secondary | ICD-10-CM | POA: Diagnosis not present

## 2017-08-15 ENCOUNTER — Ambulatory Visit: Payer: Medicare HMO | Admitting: Nurse Practitioner

## 2017-08-19 ENCOUNTER — Ambulatory Visit
Admission: RE | Admit: 2017-08-19 | Discharge: 2017-08-19 | Disposition: A | Discharge status: Home / Self Care | Payer: Medicare HMO | Source: Ambulatory Visit | Attending: Nurse Practitioner | Admitting: Nurse Practitioner

## 2017-09-02 ENCOUNTER — Telehealth: Payer: Self-pay

## 2017-09-02 NOTE — Telephone Encounter (Signed)
[employer] ph[facility] called that pt is having fall risk and drowsy because citalopram is interact with seraquel they want [place] to change to effexor as per heather pt husband advised pt need to appt for change for med

## 2017-09-02 NOTE — Telephone Encounter (Signed)
ERROR

## 2017-09-18 ENCOUNTER — Encounter: Payer: Self-pay | Admitting: Nurse Practitioner

## 2017-09-18 ENCOUNTER — Ambulatory Visit (INDEPENDENT_AMBULATORY_CARE_PROVIDER_SITE_OTHER): Payer: Medicare HMO | Admitting: Nurse Practitioner

## 2017-09-18 VITALS — BP 118/82 | HR 71 | Resp 16 | Ht 64.0 in | Wt 120.4 lb

## 2017-09-18 DIAGNOSIS — F329 Major depressive disorder, single episode, unspecified: Secondary | ICD-10-CM

## 2017-09-18 DIAGNOSIS — G309 Alzheimer's disease, unspecified: Secondary | ICD-10-CM

## 2017-09-18 DIAGNOSIS — F419 Anxiety disorder, unspecified: Secondary | ICD-10-CM | POA: Diagnosis not present

## 2017-09-18 DIAGNOSIS — M810 Age-related osteoporosis without current pathological fracture: Secondary | ICD-10-CM

## 2017-09-18 DIAGNOSIS — F0281 Dementia in other diseases classified elsewhere with behavioral disturbance: Secondary | ICD-10-CM

## 2017-09-18 DIAGNOSIS — E782 Mixed hyperlipidemia: Secondary | ICD-10-CM | POA: Diagnosis not present

## 2017-09-18 NOTE — Progress Notes (Signed)
[facility] [place], [place] [address]  Internal MEDICINE  Office Visit Note  Patient Name: Joan Peters  [id number]  [id number]  Date of Service: 09/19/2017  Chief Complaint  Patient presents with  . Dementia    worsening behavior changes.      She is in the office with her husband and her daughter. They report increased dementia. She is having episodes of severe and irrational behavior in the evenings. Happening every couple of days. Husband states that she is no longer doing anything for herself. Needs help with bathing, dressing, eating, and medication management. She almost never leaves the house. He is afraid to go anywhere on his onw for fear that she will fall or something will happen to her while he is not at home. During periods of agitation, it will require two adults to calm her and restrain her from hurting them her herself. Patient continues to see neurology. Neurologist has increased her seroquel dosing to 50mg  three times daily which does help with significant agitation. There has been questions from the pharmacy of this medication being takne while she is taking citalopram for depression. Interaction of these two medications increase risk of prolonged QT syndrome and development of serotonin syndrome. The husband and daughter have brought FL2 form with them . The are beginning to think that placement In memory care facility may be the best and safest option for her as well as the family,.    Pt is here for routine follow up.    Current Medication: Outpatient Encounter Medications as of 09/18/2017  Medication Sig  . alendronate (FOSAMAX) 70 MG tablet TAKE 1 TABLET ONE TIME WEEKLY  . aspirin 81 MG chewable tablet Chew by mouth.  . citalopram (CELEXA) 20 MG tablet 10 mg.  . meloxicam (MOBIC) 7.5 MG tablet   . QUEtiapine (SEROQUEL) 50 MG tablet Take 50 mg by mouth 3 (three) times daily.  . simvastatin (ZOCOR) 40 MG tablet Take by mouth.  .  donepezil (ARICEPT) 10 MG tablet Take by mouth.  . Omega-3 Fatty Acids (FISH OIL) 1000 MG CAPS Take by mouth.  . QUEtiapine (SEROQUEL) 25 MG tablet Take 50 mg by mouth 3 (three) times daily.    No facility-administered encounter medications on file as of 09/18/2017.     Surgical History: Past Surgical History:  Procedure Laterality Date  . ABDOMINAL HYSTERECTOMY    . CATARACT EXTRACTION    . CESAREAN SECTION    . right shoulder surgery      Medical History: Past Medical History:  Diagnosis Date  . Allergy   . Alzheimer disease   . Anxiety   . Dementia   . Depression   . Hyperlipidemia   . Migraines   . Petit mal seizure status ([place])     Family History: History reviewed. No pertinent family history.  Social History   Socioeconomic History  . Marital status: Married    Spouse name: Not on file  . Number of children: Not on file  . Years of education: Not on file  . Highest education level: Not on file  Occupational History  . Not on file  Social Needs  . Financial resource strain: Not on file  . Food insecurity:    Worry: Not on file    Inability: Not on file  . Transportation needs:    Medical: Not on file    Non-medical: Not on file  Tobacco Use  . Smoking status: Never Smoker  .  Smokeless tobacco: Never Used  Substance and Sexual Activity  . Alcohol use: Yes  . Drug use: No  . Sexual activity: Not on file  Lifestyle  . Physical activity:    Days per week: Not on file    Minutes per session: Not on file  . Stress: Not on file  Relationships  . Social connections:    Talks on phone: Not on file    Gets together: Not on file    Attends religious service: Not on file    Active member of club or organization: Not on file    Attends meetings of clubs or organizations: Not on file    Relationship status: Not on file  . Intimate partner violence:    Fear of current or ex partner: Not on file    Emotionally abused: Not on file    Physically abused: Not  on file    Forced sexual activity: Not on file  Other Topics Concern  . Not on file  Social History Narrative  . Not on file      Review of Systems  Constitutional: Positive for activity change and fatigue. Negative for chills and unexpected weight change.  HENT: Negative for congestion, postnasal drip, rhinorrhea, sneezing and sore throat.   Eyes: Negative.  Negative for redness.  Respiratory: Negative for cough, chest tightness, shortness of breath and wheezing.   Cardiovascular: Negative for chest pain and palpitations.  Gastrointestinal: Negative for abdominal distention, abdominal pain, constipation, diarrhea, nausea and vomiting.  Endocrine:       Well controlled hypothyroid   Genitourinary: Negative.  Negative for dysuria and frequency.  Musculoskeletal: Negative for arthralgias, back pain, joint swelling and neck pain.  Skin: Negative for rash.  Allergic/Immunologic: Negative for environmental allergies.  Neurological: Positive for tremors and headaches. Negative for numbness.       Progressive memory loss with agitation and severe behavior changes.   Hematological: Negative for adenopathy. Does not bruise/bleed easily.  Psychiatric/Behavioral: Positive for agitation, behavioral problems (Depression) and dysphoric mood. Negative for sleep disturbance and suicidal ideas. The patient is nervous/anxious.     Today's Vitals   09/18/17 1558  BP: 118/82  Pulse: 71  Resp: 16  SpO2: 99%  Weight: 120 lb 6.4 oz (54.6 kg)  Height: 5\' 4"  (1.626 m)    Physical Exam  Constitutional: She appears well-developed and well-nourished. No distress.  HENT:  Head: Normocephalic and atraumatic.  Mouth/Throat: Oropharynx is clear and moist. No oropharyngeal exudate.  Eyes: Pupils are equal, round, and reactive to light. Conjunctivae and EOM are normal.  Neck: Normal range of motion. Neck supple. No JVD present. Carotid bruit is not present. No tracheal deviation present. No thyromegaly  present.  Cardiovascular: Normal rate, regular rhythm and normal heart sounds. Exam reveals no gallop and no friction rub.  No murmur heard. Pulmonary/Chest: Effort normal and breath sounds normal. No respiratory distress. She has no wheezes. She has no rales. She exhibits no tenderness.  Abdominal: Soft. Bowel sounds are normal. There is no tenderness.  Musculoskeletal: Normal range of motion.  Lymphadenopathy:    She has no cervical adenopathy.  Neurological: She is alert. She displays tremor. No cranial nerve deficit.  Skin: Skin is warm and dry. She is not diaphoretic.  Psychiatric: Her mood appears anxious. Her affect is angry. She is agitated and withdrawn. Cognition and memory are impaired. She expresses inappropriate judgment. She exhibits a depressed mood.  Patient did not speak throughout the visit. Did not make eye  contact with myself, her husband, and her daughter. Few gestures made were angry and not fitting in with conversation.   Nursing note and vitals reviewed.  Assessment/Plan: 1. Alzheimer's dementia with behavioral disturbance, unspecified timing of dementia onset Rapidly deteriorating mental status. Becoming more and more difficult for husband to manage alone. Daughter dtaying with them for now, but this is temporary. Will refer to hospice and palliative car for consultation. Will also complete FL2 form in case placement in memory care unit is best option. She should continue to see neurology as scheduled.  - Ambulatory referral to Hospice  2. Mixed hyperlipidemia Continue simvastatin as prescribed.   3. Osteoporosis of multiple sites Continue fosamax weekly.   4. Major depression, chronic Reviewed interaction of citalopram and seroquel with the family. Will have her continue citalopram for now. Discussed slowly weaning her off of this in the future.   5. Anxiety Continue seroquel as prescribed per neurology.   General Counseling: Haevyn verbalizes understanding of  the findings of todays visit and agrees with plan of treatment. I have discussed any further diagnostic evaluation that may be needed or ordered today. We also reviewed her medications today. she has been encouraged to call the office with any questions or concerns that should arise related to todays visit.    Counseling:  This patient was seen by Leretha Pol, FNP- C in Collaboration with Dr Lavera Guise as a part of collaborative care agreement  Orders Placed This Encounter  Procedures  . Ambulatory referral to Hospice     Time spent: [facility]

## 2017-09-19 ENCOUNTER — Telehealth: Payer: Self-pay

## 2017-09-19 DIAGNOSIS — F329 Major depressive disorder, single episode, unspecified: Secondary | ICD-10-CM | POA: Insufficient documentation

## 2017-09-19 NOTE — Telephone Encounter (Signed)
Called hospice pallative care [phone] and spoke with Marita [place] that we going to faxed order for [facility] /Pallative care evaluate and treat  And also informed pt fl2 form is ready and we faxed order for hospice

## 2017-09-19 NOTE — Telephone Encounter (Signed)
Spoke with [facility] nurse and gave verbal for evaluate and treat and also faxed order

## 2017-09-22 ENCOUNTER — Other Ambulatory Visit: Payer: Self-pay

## 2017-09-22 ENCOUNTER — Telehealth: Payer: Self-pay

## 2017-09-22 ENCOUNTER — Other Ambulatory Visit: Payer: Self-pay | Admitting: Nurse Practitioner

## 2017-09-22 DIAGNOSIS — F0281 Dementia in other diseases classified elsewhere with behavioral disturbance: Secondary | ICD-10-CM

## 2017-09-22 DIAGNOSIS — G309 Alzheimer's disease, unspecified: Principal | ICD-10-CM

## 2017-09-22 MED ORDER — LORAZEPAM 0.5 MG PO TABS: 0.5000 mg | ORAL_TABLET | Freq: Three times a day (TID) | ORAL | 2 refills | Status: DC | PRN

## 2017-09-22 NOTE — Telephone Encounter (Signed)
D/c seroquesl. Add lorazepam 0.5mg  up to three times daily if needed for agitation. New rx sent to [facility]. If this does not work as well or better thand seroquel, we can always go back.

## 2017-09-22 NOTE — Telephone Encounter (Signed)
Pt husband advised please d/c seraquel and start lorazepam as per heather

## 2017-09-22 NOTE — Progress Notes (Signed)
D/c seroquesl. Add lorazepam 0.5mg  up to three times daily if needed for agitation. New rx sent to [facility].

## 2017-09-22 NOTE — Telephone Encounter (Signed)
Spoke with denise from hospice and faxed form for pallative care

## 2017-09-24 ENCOUNTER — Other Ambulatory Visit: Payer: Self-pay

## 2017-09-24 NOTE — Telephone Encounter (Signed)
Pt husband called hat ativan making her worse more agitation as per heather advised pt please d/c ativan and start seraquel 50mg  three timea aday makr=e sure don't gave her seraquel and lativan together

## 2017-09-25 DIAGNOSIS — Z8659 Personal history of other mental and behavioral disorders: Secondary | ICD-10-CM | POA: Diagnosis not present

## 2017-09-25 DIAGNOSIS — Z87898 Personal history of other specified conditions: Secondary | ICD-10-CM | POA: Diagnosis not present

## 2017-09-25 DIAGNOSIS — G301 Alzheimer's disease with late onset: Secondary | ICD-10-CM | POA: Diagnosis not present

## 2017-09-25 DIAGNOSIS — K5900 Constipation, unspecified: Secondary | ICD-10-CM | POA: Diagnosis not present

## 2017-09-25 DIAGNOSIS — G43109 Migraine with aura, not intractable, without status migrainosus: Secondary | ICD-10-CM | POA: Diagnosis not present

## 2017-09-25 DIAGNOSIS — F0281 Dementia in other diseases classified elsewhere with behavioral disturbance: Secondary | ICD-10-CM | POA: Diagnosis not present

## 2017-10-06 ENCOUNTER — Other Ambulatory Visit: Payer: Self-pay | Admitting: Internal Medicine

## 2017-10-08 ENCOUNTER — Telehealth: Payer: Self-pay | Admitting: Internal Medicine

## 2017-10-08 NOTE — Telephone Encounter (Signed)
Spoke with mr Joan Peters regarding the flt forms , state is requiring a fax , went ahead and faxed the form to the state

## 2017-10-09 ENCOUNTER — Encounter: Payer: Self-pay | Admitting: Emergency Medicine

## 2017-10-09 ENCOUNTER — Other Ambulatory Visit: Payer: Self-pay

## 2017-10-09 ENCOUNTER — Emergency Department
Admission: EM | Admit: 2017-10-09 | Discharge: 2017-10-09 | Disposition: A | Discharge status: Home / Self Care | Payer: Medicare HMO | Attending: Emergency Medicine | Admitting: Emergency Medicine

## 2017-10-09 DIAGNOSIS — E782 Mixed hyperlipidemia: Secondary | ICD-10-CM | POA: Insufficient documentation

## 2017-10-09 DIAGNOSIS — Z79899 Other long term (current) drug therapy: Secondary | ICD-10-CM | POA: Diagnosis not present

## 2017-10-09 DIAGNOSIS — F0390 Unspecified dementia without behavioral disturbance: Secondary | ICD-10-CM | POA: Diagnosis not present

## 2017-10-09 DIAGNOSIS — R627 Adult failure to thrive: Secondary | ICD-10-CM | POA: Diagnosis not present

## 2017-10-09 DIAGNOSIS — Z66 Do not resuscitate: Secondary | ICD-10-CM | POA: Diagnosis not present

## 2017-10-09 DIAGNOSIS — G309 Alzheimer's disease, unspecified: Secondary | ICD-10-CM | POA: Insufficient documentation

## 2017-10-09 DIAGNOSIS — R9431 Abnormal electrocardiogram [ECG] [EKG]: Secondary | ICD-10-CM | POA: Diagnosis not present

## 2017-10-09 DIAGNOSIS — Z7982 Long term (current) use of aspirin: Secondary | ICD-10-CM | POA: Diagnosis not present

## 2017-10-09 DIAGNOSIS — Z7189 Other specified counseling: Secondary | ICD-10-CM

## 2017-10-09 LAB — CBC WITH DIFFERENTIAL/PLATELET
Basophils Absolute: 0 10*3/uL (ref 0–0.1)
Basophils Relative: 0 %
Eosinophils Absolute: 0 10*3/uL (ref 0–0.7)
Eosinophils Relative: 1 %
HCT: 38.2 % (ref 35.0–47.0)
HEMOGLOBIN: 12.6 g/dL (ref 12.0–16.0)
LYMPHS ABS: 1.1 10*3/uL (ref 1.0–3.6)
Lymphocytes Relative: 30 %
MCH: 28.7 pg (ref 26.0–34.0)
MCHC: 33 g/dL (ref 32.0–36.0)
MCV: 86.9 fL (ref 80.0–100.0)
MONOS PCT: 9 %
Monocytes Absolute: 0.4 10*3/uL (ref 0.2–0.9)
NEUTROS ABS: 2.3 10*3/uL (ref 1.4–6.5)
NEUTROS PCT: 60 %
Platelets: 221 10*3/uL (ref 150–440)
RBC: 4.4 MIL/uL (ref 3.80–5.20)
RDW: 14.2 % (ref 11.5–14.5)
WBC: 3.8 10*3/uL (ref 3.6–11.0)

## 2017-10-09 LAB — COMPREHENSIVE METABOLIC PANEL
ALT: 9 U/L (ref 0–44)
ANION GAP: 8 (ref 5–15)
AST: 14 U/L — ABNORMAL LOW (ref 15–41)
Albumin: 3.8 g/dL (ref 3.5–5.0)
Alkaline Phosphatase: 70 U/L (ref 38–126)
BUN: 22 mg/dL (ref 8–23)
CHLORIDE: 105 mmol/L (ref 98–111)
CO2: 29 mmol/L (ref 22–32)
CREATININE: 0.38 mg/dL — AB (ref 0.44–1.00)
Calcium: 9.1 mg/dL (ref 8.9–10.3)
Glucose, Bld: 89 mg/dL (ref 70–99)
POTASSIUM: 3.6 mmol/L (ref 3.5–5.1)
SODIUM: 142 mmol/L (ref 135–145)
Total Bilirubin: 0.3 mg/dL (ref 0.3–1.2)
Total Protein: 6.9 g/dL (ref 6.5–8.1)

## 2017-10-09 LAB — BETA-HYDROXYBUTYRIC ACID: BETA-HYDROXYBUTYRIC ACID: 0.1 mmol/L (ref 0.05–0.27)

## 2017-10-09 LAB — TROPONIN I: Troponin I: <0.03 ng/mL (ref <0.03)

## 2017-10-09 NOTE — Discharge Instructions (Addendum)
As we discussed, there are no acute abnormalities on Joan Peters work-up today and her vital signs are reassuring.  Unfortunately we believe her symptoms are the result of her worsening dementia.  As per our discussion, I filled out the DNR paperwork and gave it to Joan Peters to keep with Joan Peters at all times.  Please talk with your primary care provider about this discussion and paperwork that you now have in your possession.  Please continue to work with your primary care provider for placement in a skilled nursing facility.  Return to the emergency department if you develop new or worsening symptoms that concern you.

## 2017-10-09 NOTE — ED Triage Notes (Signed)
Pt presents to ED via POV with her husband. Pt's husband reports pt has dementia and over the last few weeks pt has had decreased PO intake, decreased bowel movements and has been picking at her mouth. Pt's husband reports that patient started taking Seroquel approx 1.5 months ago and that is around the time when symptoms started. Pt's husband reports biggest concern is possible dehydration.

## 2017-10-09 NOTE — ED Provider Notes (Signed)
[facility] Emergency Department Provider Note  ____________________________________________   First MD Initiated Contact with Patient 10/09/17 1844     (approximate)  I have reviewed the triage vital signs and the nursing notes.   HISTORY  Chief Complaint Failure To Thrive  Level 5 caveat:  history/ROS limited by chronic dementia  HPI Joan Peters is a 72 y.o. female with advanced chronic dementia who presents with her family (husband and 2 adult daughters) by private vehicle for evaluation of gradually worsening symptoms at home.  Over the last few weeks in particular the patient has had a significant decline in her overall status, with significant decreased oral intake including both liquids and food.  She has had decreased bowel movements and has had worsening hallucinations, "picking" behaviors, and decreased recognition of the people and events going on around her.  The patient's husband is working with their primary care provider for placement in a skilled nursing facility.  The 2 daughters are visiting from [place] to help out and were concerned that the patient is dehydrated or may have other acute medical issues which are worsening her chronic dementia.  The placement process is coming along slowly and they are frustrated and concern for her health and safety at home given the amount of work and effort it requires to try to keep her safe and make sure she does not get into dangerous situations such as deciding to leave the house without supervision.  The patient is awake and seems to be trying to follow the conversation but clearly is not following well.  She is conversant and will answer questions when asked specifically about things.  She denies any pain at this time and denies shortness of breath.  She is otherwise unable to participate and a history.  The family reports that the symptoms are severe but have been gradual in onset over a long period of  time as she has no acute changes over the last few days.  Past Medical History:  Diagnosis Date  . Allergy   . Alzheimer disease   . Anxiety   . Dementia   . Depression   . Hyperlipidemia   . Migraines   . Petit mal seizure status [facility])     Patient Active Problem List   Diagnosis Date Noted  . Major depression, chronic 09/19/2017  . Mixed hyperlipidemia 06/15/2017  . Anxiety 06/15/2017  . Alzheimer disease 06/15/2017  . Osteoporosis of multiple sites 06/15/2017    Past Surgical History:  Procedure Laterality Date  . ABDOMINAL HYSTERECTOMY    . CATARACT EXTRACTION    . CESAREAN SECTION    . right shoulder surgery      Prior to Admission medications   Medication Sig Start Date End Date Taking? Authorizing Provider  alendronate (FOSAMAX) 70 MG tablet TAKE 1 TABLET ONE TIME WEEKLY 05/13/17   Ronnell Freshwater, NP  aspirin 81 MG chewable tablet Chew by mouth.    [provider]  citalopram (CELEXA) 20 MG tablet 10 mg. 02/03/14   [provider]  donepezil (ARICEPT) 10 MG tablet Take by mouth. 11/20/16   [provider]  meloxicam (MOBIC) 7.5 MG tablet  11/27/16   [provider]  Omega-3 Fatty Acids (FISH OIL) 1000 MG CAPS Take by mouth.    [provider]  QUEtiapine (SEROQUEL) 50 MG tablet Take 50 mg by mouth 3 (three) times daily.    [provider]  simvastatin (ZOCOR) 40 MG tablet TAKE 1  TABLET AT BEDTIME 10/06/17   Ronnell Freshwater, NP    Allergies Sulfa antibiotics  No family history on file.  Social History Social History   Tobacco Use  . Smoking status: Never Smoker  . Smokeless tobacco: Never Used  Substance Use Topics  . Alcohol use: Yes  . Drug use: No    Review of Systems Level 5 caveat:  history/ROS limited by chronic dementia ____________________________________________   PHYSICAL EXAM:  VITAL SIGNS: ED Triage Vitals  Enc Vitals Group     BP 10/09/17 1711 (!) 153/88     Pulse Rate 10/09/17  1711 75     Resp 10/09/17 1711 18     Temp 10/09/17 1711 98.8 F (37.1 C)     Temp Source 10/09/17 1711 Oral     SpO2 10/09/17 1711 98 %     Weight 10/09/17 1712 53.1 kg (117 lb)     Height 10/09/17 1712 1.626 m (5\' 4" )     Head Circumference --      Peak Flow --      Pain Score --      Pain Loc --      Pain Edu? --      Excl. in [place]? --     Constitutional: Alert but disoriented which is her new baseline.  No acute distress Eyes: Conjunctivae are normal.  Head: Atraumatic. Nose: No congestion/rhinnorhea. Mouth/Throat: Mucous membranes are moist. Neck: No stridor.  No meningeal signs.   Cardiovascular: Normal rate, regular rhythm. Good peripheral circulation. Grossly normal heart sounds. Respiratory: Normal respiratory effort.  No retractions. Lungs CTAB. Gastrointestinal: Soft and nontender. No distention.  Musculoskeletal: No lower extremity tenderness nor edema. No gross deformities of extremities. Neurologic:  Normal speech and language when asked specific questions but otherwise is not able to participate in much of an interview. No gross focal neurologic deficits are appreciated.  Skin:  Skin is warm, dry and intact. No rash noted.   ____________________________________________   LABS (all labs ordered are listed, but only abnormal results are displayed)  Labs Reviewed  COMPREHENSIVE METABOLIC PANEL - Abnormal; Notable for the following components:      Result Value   Creatinine, Ser 0.38 (*)    AST 14 (*)    All other components within normal limits  CBC WITH DIFFERENTIAL/PLATELET  BETA-HYDROXYBUTYRIC ACID  TROPONIN I   ____________________________________________  EKG  ED ECG REPORT I, Hinda Kehr, the attending physician, personally viewed and interpreted this ECG.  Date: 10/09/2017 EKG Time: 17: 24 Rate: 82 Rhythm: normal sinus rhythm QRS Axis: normal Intervals: normal ST/T Wave abnormalities: Non-specific ST segment / T-wave changes, but no evidence  of acute ischemia. Narrative Interpretation: no evidence of acute ischemia   ____________________________________________  RADIOLOGY   ED MD interpretation: No indication for imaging tonight  Official radiology report(s): No results found.  ____________________________________________   PROCEDURES  Critical Care performed: No   Procedure(s) performed:   Procedures   ____________________________________________   INITIAL IMPRESSION / ASSESSMENT AND PLAN / ED COURSE  As part of my medical decision making, I reviewed the following data within the electronic MEDICAL RECORD NUMBER History obtained from family, Nursing notes reviewed and incorporated, Labs reviewed , EKG interpreted , Old chart reviewed and Notes from prior ED visits    Differential diagnosis includes, but is not limited to, worsening dementia, acute delirium secondary to an infectious process including urinary tract infection, dehydration, electrolyte/metabolic abnormality, acute intracranial bleeding, CVA.  However the patient is in no  acute distress with normal vital signs and afebrile.  She denies any symptoms at this time.  The patient's family is obviously very concerned about her.  I had an extensive and lengthy talk with the patient's husband and 2 daughters about her chronic condition, prognosis, the things they will likely witness in her as her dementia continues to worsen.  We talked about the reassuring results of her labs that demonstrate no acute medical abnormality and no explanation for her symptoms beyond her dementia.  I explained that in my opinion I would not perform an and out catheterization for her given the probability of asymptomatic bacteriuria and the limited benefit to finding out results when she is not having any urinary complaints.  I also explained that given no clinical signs of dehydration I would not recommend forcing an IV upon the patient to give IV fluids when this is a very short-term  solution to a long-term problem.  I did, however, offer that if they felt strongly about either of these interventions I would proceed but it would not be my recommendation.  The husband and 2 daughters are very reasonable and they understand although are frustrated by the nature of the symptoms and the long-term prognosis and the patient's condition.  They do not want to give IV fluids or perform in and out catheterization at this time.  I discussed CODE STATUS with the patient's family and the husband feels adamantly that he wants her to be DNR.  At his request I filled out the [facility] DNR form and provided it to him and explained to him that he should keep it with her at all times, when going to the doctor's office and to the emergency department or when calling 911.  He understands and agrees with the plan and the 2 daughters are in agreement as well.  I also explained that this does not mean "do not treat" and that should she come back to the emergency department with reversible conditions we will do our best to treat them.  They are comfortable with this plan.  They will follow-up with the primary care provider to continue working towards skilled nursing facility placement.  I gave my usual and customary return precautions.     ____________________________________________  FINAL CLINICAL IMPRESSION(S) / ED DIAGNOSES  Final diagnoses:  Dementia without behavioral disturbance, unspecified dementia type  Failure to thrive in adult  DNR (do not resuscitate) discussion     MEDICATIONS GIVEN DURING THIS VISIT:  Medications - No data to display   ED Discharge Orders    None       Note:  This document was prepared using Dragon voice recognition software and may include unintentional dictation errors.    Hinda Kehr, MD 10/09/17 2256

## 2017-10-17 DIAGNOSIS — Z515 Encounter for palliative care: Secondary | ICD-10-CM | POA: Diagnosis not present

## 2017-10-17 DIAGNOSIS — G309 Alzheimer's disease, unspecified: Secondary | ICD-10-CM | POA: Diagnosis not present

## 2017-10-21 DIAGNOSIS — G301 Alzheimer's disease with late onset: Secondary | ICD-10-CM | POA: Diagnosis not present

## 2017-10-21 DIAGNOSIS — Z87898 Personal history of other specified conditions: Secondary | ICD-10-CM | POA: Diagnosis not present

## 2017-10-21 DIAGNOSIS — F0281 Dementia in other diseases classified elsewhere with behavioral disturbance: Secondary | ICD-10-CM | POA: Diagnosis not present

## 2017-10-21 DIAGNOSIS — G43109 Migraine with aura, not intractable, without status migrainosus: Secondary | ICD-10-CM | POA: Diagnosis not present

## 2017-10-21 DIAGNOSIS — Z8659 Personal history of other mental and behavioral disorders: Secondary | ICD-10-CM | POA: Diagnosis not present

## 2017-10-24 ENCOUNTER — Telehealth: Payer: Self-pay

## 2017-10-24 NOTE — Telephone Encounter (Signed)
Patient's husband came by regarding FL2 forms and states [facility] has the form but per the state the diagnosis on the form should be her primary diagnosis alkeimers and needs MD signature they can not accept NP signature, I advised them to bring the form back to [place] and I can have Dr Humphrey Rolls put the correct diagnosis and resign under [facility] name. Beth

## 2017-10-28 ENCOUNTER — Telehealth: Payer: Self-pay

## 2017-10-28 NOTE — Telephone Encounter (Signed)
KELLY FROM [facility] CALLED SAYING THE LETTER WE SENT PREVIOUSLY DID NOT PROPERLY STATE THE INFO THEY NEEDED. THE LETTER WAS REWRITTEN AND FAXED TO HER TODAY.

## 2017-10-30 DIAGNOSIS — G300 Alzheimer's disease with early onset: Secondary | ICD-10-CM | POA: Diagnosis not present

## 2017-10-30 DIAGNOSIS — R1312 Dysphagia, oropharyngeal phase: Secondary | ICD-10-CM | POA: Diagnosis not present

## 2017-10-31 DIAGNOSIS — F0391 Unspecified dementia with behavioral disturbance: Secondary | ICD-10-CM | POA: Diagnosis not present

## 2017-10-31 DIAGNOSIS — F0280 Dementia in other diseases classified elsewhere without behavioral disturbance: Secondary | ICD-10-CM | POA: Diagnosis not present

## 2017-10-31 DIAGNOSIS — G309 Alzheimer's disease, unspecified: Secondary | ICD-10-CM | POA: Diagnosis not present

## 2017-10-31 DIAGNOSIS — F329 Major depressive disorder, single episode, unspecified: Secondary | ICD-10-CM | POA: Diagnosis not present

## 2017-10-31 DIAGNOSIS — R1312 Dysphagia, oropharyngeal phase: Secondary | ICD-10-CM | POA: Diagnosis not present

## 2017-10-31 DIAGNOSIS — G300 Alzheimer's disease with early onset: Secondary | ICD-10-CM | POA: Diagnosis not present

## 2017-11-01 DIAGNOSIS — R1312 Dysphagia, oropharyngeal phase: Secondary | ICD-10-CM | POA: Diagnosis not present

## 2017-11-01 DIAGNOSIS — G300 Alzheimer's disease with early onset: Secondary | ICD-10-CM | POA: Diagnosis not present

## 2017-11-02 ENCOUNTER — Emergency Department: Payer: Medicare HMO

## 2017-11-02 ENCOUNTER — Other Ambulatory Visit: Payer: Self-pay

## 2017-11-02 ENCOUNTER — Emergency Department
Admission: EM | Admit: 2017-11-02 | Discharge: 2017-11-02 | Disposition: A | Discharge status: Skilled Nursing Facility | Payer: Medicare HMO | Attending: Emergency Medicine | Admitting: Emergency Medicine

## 2017-11-02 ENCOUNTER — Encounter: Payer: Self-pay | Admitting: Emergency Medicine

## 2017-11-02 DIAGNOSIS — Y999 Unspecified external cause status: Secondary | ICD-10-CM | POA: Diagnosis not present

## 2017-11-02 DIAGNOSIS — G300 Alzheimer's disease with early onset: Secondary | ICD-10-CM | POA: Diagnosis not present

## 2017-11-02 DIAGNOSIS — F0281 Dementia in other diseases classified elsewhere with behavioral disturbance: Secondary | ICD-10-CM | POA: Diagnosis not present

## 2017-11-02 DIAGNOSIS — S0101XA Laceration without foreign body of scalp, initial encounter: Secondary | ICD-10-CM | POA: Diagnosis not present

## 2017-11-02 DIAGNOSIS — W19XXXA Unspecified fall, initial encounter: Secondary | ICD-10-CM | POA: Diagnosis not present

## 2017-11-02 DIAGNOSIS — Y939 Activity, unspecified: Secondary | ICD-10-CM | POA: Diagnosis not present

## 2017-11-02 DIAGNOSIS — Z7982 Long term (current) use of aspirin: Secondary | ICD-10-CM | POA: Insufficient documentation

## 2017-11-02 DIAGNOSIS — R1312 Dysphagia, oropharyngeal phase: Secondary | ICD-10-CM | POA: Diagnosis not present

## 2017-11-02 DIAGNOSIS — S0990XA Unspecified injury of head, initial encounter: Secondary | ICD-10-CM | POA: Diagnosis not present

## 2017-11-02 DIAGNOSIS — G309 Alzheimer's disease, unspecified: Secondary | ICD-10-CM | POA: Diagnosis not present

## 2017-11-02 DIAGNOSIS — S0181XA Laceration without foreign body of other part of head, initial encounter: Secondary | ICD-10-CM | POA: Diagnosis not present

## 2017-11-02 DIAGNOSIS — W06XXXA Fall from bed, initial encounter: Secondary | ICD-10-CM | POA: Insufficient documentation

## 2017-11-02 DIAGNOSIS — Z79899 Other long term (current) drug therapy: Secondary | ICD-10-CM | POA: Diagnosis not present

## 2017-11-02 DIAGNOSIS — R296 Repeated falls: Secondary | ICD-10-CM | POA: Diagnosis not present

## 2017-11-02 DIAGNOSIS — F419 Anxiety disorder, unspecified: Secondary | ICD-10-CM | POA: Diagnosis not present

## 2017-11-02 DIAGNOSIS — S0993XA Unspecified injury of face, initial encounter: Secondary | ICD-10-CM | POA: Diagnosis present

## 2017-11-02 DIAGNOSIS — R4182 Altered mental status, unspecified: Secondary | ICD-10-CM | POA: Diagnosis not present

## 2017-11-02 DIAGNOSIS — Z7401 Bed confinement status: Secondary | ICD-10-CM | POA: Diagnosis not present

## 2017-11-02 DIAGNOSIS — Y92122 Bedroom in nursing home as the place of occurrence of the external cause: Secondary | ICD-10-CM | POA: Insufficient documentation

## 2017-11-02 LAB — CBC
HEMATOCRIT: 38.6 % (ref 35.0–47.0)
Hemoglobin: 12.8 g/dL (ref 12.0–16.0)
MCH: 28.9 pg (ref 26.0–34.0)
MCHC: 33.2 g/dL (ref 32.0–36.0)
MCV: 86.9 fL (ref 80.0–100.0)
Platelets: 183 10*3/uL (ref 150–440)
RBC: 4.44 MIL/uL (ref 3.80–5.20)
RDW: 14.1 % (ref 11.5–14.5)
WBC: 5.8 10*3/uL (ref 3.6–11.0)

## 2017-11-02 LAB — COMPREHENSIVE METABOLIC PANEL
ALBUMIN: 3.6 g/dL (ref 3.5–5.0)
ALT: 10 U/L (ref 0–44)
AST: 18 U/L (ref 15–41)
Alkaline Phosphatase: 60 U/L (ref 38–126)
Anion gap: 7 (ref 5–15)
BILIRUBIN TOTAL: 0.5 mg/dL (ref 0.3–1.2)
BUN: 19 mg/dL (ref 8–23)
CALCIUM: 9 mg/dL (ref 8.9–10.3)
CO2: 28 mmol/L (ref 22–32)
Chloride: 105 mmol/L (ref 98–111)
Creatinine, Ser: 0.35 mg/dL — ABNORMAL LOW (ref 0.44–1.00)
GFR calc Af Amer: >60 mL/min (ref >60)
GFR calc non Af Amer: >60 mL/min (ref >60)
GLUCOSE: 97 mg/dL (ref 70–99)
Potassium: 3.7 mmol/L (ref 3.5–5.1)
Sodium: 140 mmol/L (ref 135–145)
TOTAL PROTEIN: 6.7 g/dL (ref 6.5–8.1)

## 2017-11-02 LAB — URINALYSIS, COMPLETE (UACMP) WITH MICROSCOPIC
BACTERIA UA: NONE SEEN
BILIRUBIN URINE: NEGATIVE
Glucose, UA: NEGATIVE mg/dL
Hgb urine dipstick: NEGATIVE
Ketones, ur: 20 mg/dL — AB
LEUKOCYTES UA: NEGATIVE
Nitrite: NEGATIVE
Protein, ur: NEGATIVE mg/dL
SPECIFIC GRAVITY, URINE: 1.015 (ref 1.005–1.030)
SQUAMOUS EPITHELIAL / LPF: NONE SEEN (ref 0–5)
pH: 7 (ref 5.0–8.0)

## 2017-11-02 LAB — GLUCOSE, CAPILLARY: GLUCOSE-CAPILLARY: 89 mg/dL (ref 70–99)

## 2017-11-02 LAB — TROPONIN I: Troponin I: <0.03 ng/mL (ref <0.03)

## 2017-11-02 MED ORDER — LORAZEPAM 2 MG/ML IJ SOLN
0.5000 mg | Freq: Once | INTRAMUSCULAR | Status: AC
  Administered 2017-11-02: 0.5 mg via INTRAVENOUS
  Filled 2017-11-02: qty 1

## 2017-11-02 MED ORDER — LIDOCAINE-EPINEPHRINE 2 %-1:100000 IJ SOLN
INTRAMUSCULAR | Status: AC
  Administered 2017-11-02: 15:00:00
  Filled 2017-11-02: qty 1

## 2017-11-02 MED ORDER — LORAZEPAM 2 MG/ML IJ SOLN
1.0000 mg | Freq: Once | INTRAMUSCULAR | Status: AC
  Administered 2017-11-02: 1 mg via INTRAMUSCULAR
  Filled 2017-11-02: qty 1

## 2017-11-02 MED ORDER — SODIUM CHLORIDE 0.9 % IV BOLUS
500.0000 mL | Freq: Once | INTRAVENOUS | Status: AC
  Administered 2017-11-02: 500 mL via INTRAVENOUS

## 2017-11-02 NOTE — Discharge Instructions (Signed)
You have been seen in the Emergency Department (ED) today for a fall.   Please follow up with your doctor regarding today's Emergency Department (ED) visit and your recent fall.    Return to the ED if you have any headache, confusion, slurred speech, weakness/numbness of any arm or leg, or any increased pain.

## 2017-11-02 NOTE — ED Notes (Signed)
Family at bedside. 

## 2017-11-02 NOTE — ED Notes (Signed)
Pt was placed on the bedpan but did not urinate. Peri care was performed and a new incontinent brief was placed on pt.

## 2017-11-02 NOTE — ED Notes (Signed)
Pt to CT with Tech

## 2017-11-02 NOTE — ED Notes (Signed)
Pt is alert and disoriented x4 with a h/o dementia. Pt is confused and actively trying to get out of bed. A safety sitter has been assigned to the pt. Pt has laceration to the left side of her forehead with hemorrhaging. She does not show any signs of distress. We will continue to monitor the pt.

## 2017-11-02 NOTE — ED Provider Notes (Signed)
Signout from Dr. Georgiana Spinner in the 72 year old female status post fall and suturing who is pending a urinalysis.  Plan is for discharge plus or minus antibiotics depending on the results of the urinalysis.  Physical Exam  There were no vitals taken for this visit.  ----------------------------------------- 4:45 PM on 11/02/2017 -----------------------------------------  Physical Exam Patient resting without any distress at this time.  Family at the bedside. ED Course/Procedures   Clinical Course as of Nov 03 1643  Nancy Fetter Nov 02, 2017  1352 Discussed with nurse, she is been intending to a critical patient.  Now planning to provide Ativan, obtain labs, and obtain CT as able.  Family at bedside attentive.   [MQ]    Clinical Course User Index [MQ] Delman Kitten, MD    Procedures  MDM  Urinalysis without signs of infection.  Patient will be discharged back to her skilled nursing facility.  Family is requesting ambulance transport they are understanding of the urinalysis results as well as the diagnosis and treatment plan.  They say that they are continuing to find other placement besides the patient current SNF.      Orbie Pyo, MD 11/02/17 [phone]

## 2017-11-02 NOTE — ED Triage Notes (Signed)
Pt ems from [facility] s/p multiple falls. Here today s/p fall with left forehead lac. Pt with hx dementia.

## 2017-11-02 NOTE — NC FL2 (Signed)
  [place] LEVEL OF CARE SCREENING TOOL     IDENTIFICATION  Patient Name: Joan Peters Birthdate: 20-Oct-1945 Sex: female Admission Date (Current Location): 11/02/2017  [place] and [place] Number:  Engineering geologist and Address:  [facility], [address] [place], [place] [address]      Provider Number: [id number]  Attending Physician Name and Address:  Delman Kitten, MD  Relative Name and Phone Number:  Cheree Ditto [phone] or Francine Graven [phone]    Current Level of Care: Hospital Recommended Level of Care: Memory Care, [place] Prior Approval Number:    Date Approved/Denied:   PASRR Number:  [id number]  Discharge Plan: Other (Comment)(Memory Care)    Current Diagnoses: Patient Active Problem List   Diagnosis Date Noted  . Major depression, chronic 09/19/2017  . Mixed hyperlipidemia 06/15/2017  . Anxiety 06/15/2017  . Alzheimer disease 06/15/2017  . Osteoporosis of multiple sites 06/15/2017    Orientation RESPIRATION BLADDER Height & Weight        Normal Continent Weight:   Height:     BEHAVIORAL SYMPTOMS/MOOD NEUROLOGICAL BOWEL NUTRITION STATUS      Continent Diet(normal)  AMBULATORY STATUS COMMUNICATION OF NEEDS Skin   Supervision Non-Verbally Normal, Bruising                       Personal Care Assistance Level of Assistance  Bathing, Feeding, Dressing, Total care Bathing Assistance: Limited assistance Feeding assistance: Independent Dressing Assistance: Limited assistance Total Care Assistance: Limited assistance   Functional Limitations Info  Sight, Hearing, Speech Sight Info: Adequate Hearing Info: Adequate Speech Info: Impaired(Poor communication skills- unable to speak word clearly)    SPECIAL CARE FACTORS FREQUENCY                       Contractures Contractures Info: Not present    Additional Factors Info  Allergies   Allergies Info: Sulpha   antibiotics           Current Medications (11/02/2017):  This is the current hospital active medication list Current Facility-Administered Medications  Medication Dose Route Frequency Provider Last Rate Last Dose  . lidocaine-EPINEPHrine (XYLOCAINE W/EPI) 2 %-1:100000 (with pres) injection           . sodium chloride 0.9 % bolus 500 mL  500 mL Intravenous Once Delman Kitten, MD       Current Outpatient Medications  Medication Sig Dispense Refill  . alendronate (FOSAMAX) 70 MG tablet TAKE 1 TABLET ONE TIME WEEKLY 12 tablet 2  . aspirin 81 MG chewable tablet Chew by mouth.    . citalopram (CELEXA) 20 MG tablet 10 mg.    . donepezil (ARICEPT) 10 MG tablet Take by mouth.    . meloxicam (MOBIC) 7.5 MG tablet     . Omega-3 Fatty Acids (FISH OIL) 1000 MG CAPS Take by mouth.    . QUEtiapine (SEROQUEL) 50 MG tablet Take 50 mg by mouth 3 (three) times daily.    . simvastatin (ZOCOR) 40 MG tablet TAKE 1 TABLET AT BEDTIME 90 tablet 1     Discharge Medications: Please see discharge summary for a list of discharge medications.  Relevant Imaging Results:  Relevant Lab Results:   Additional Information SSN # [id number]  Joan Peters, [place]

## 2017-11-02 NOTE — Clinical Social Work Note (Signed)
Clinical Social Work Assessment  Patient Details  Name: Joan Peters MRN: [id number] Date of Birth: [birth year]  Date of referral:  11/02/17               Reason for consult:  Facility Placement                Permission sought to share information with:  Family Supports, Customer service manager Permission granted to share information::  Yes, Verbal Permission Granted  Name::     Husband Lew Dawes Kreutzer-[phone] Cheree Ditto daughter  [phone]   Agency::     Relationship::     Contact Information:  All memory care facilities  Housing/Transportation Living arrangements for the past 2 months:  [place] of Information:  Power of [place], Adult Children Patient Interpreter Needed:  None Criminal Activity/Legal Involvement Pertinent to Current Situation/Hospitalization:    Significant Relationships:  Adult Children, Spouse Lives with:  Facility Resident Do you feel safe going back to the place where you live?  Yes Need for family participation in patient care:  Yes (Comment)  Care giving concerns: Family would like to pursue offers for memory care facility   Social Worker assessment / plan: LCSW met with family and patient is currently at a facility but requires a memory care facility. Please contact family members direct as noted on "comment to facility". Patient needs memory care and walks/ and requires assistance with all her ADL's - excellent family involvement. Patient is non verbal and has been diagnosed with Alzheimer.    Employment status:  Retired Forensic scientist:  Self Pay (Medicaid Pending), Medicare([employer] Medicare) PT Recommendations:  Not assessed at this time Information / Referral to community resources:     Patient/Family's Response to care: Good understanding  Patient/Family's Understanding of and Emotional Response to Diagnosis, Current Treatment, and Prognosis: Good understanding  Emotional  Assessment Appearance:  Appears stated age Attitude/Demeanor/Rapport:  Unable to Assess([place]) Affect (typically observed):  Unable to Assess Orientation:  Oriented to Self Alcohol / Substance use:  Not Applicable Psych involvement (Current and /or in the community):  No (Comment)  Discharge Needs  Concerns to be addressed:  No discharge needs identified Readmission within the last 30 days:    Current discharge risk:  None Barriers to Discharge:  No Barriers Identified   Joana Reamer, LCSW 11/02/2017, 3:30 PM
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
## 2017-11-04 DIAGNOSIS — R1312 Dysphagia, oropharyngeal phase: Secondary | ICD-10-CM | POA: Diagnosis not present

## 2017-11-04 DIAGNOSIS — G300 Alzheimer's disease with early onset: Secondary | ICD-10-CM | POA: Diagnosis not present

## 2017-11-04 LAB — URINE CULTURE
CULTURE: NO GROWTH
Special Requests: NORMAL

## 2017-11-05 DIAGNOSIS — G309 Alzheimer's disease, unspecified: Secondary | ICD-10-CM | POA: Diagnosis not present

## 2017-11-05 DIAGNOSIS — R1312 Dysphagia, oropharyngeal phase: Secondary | ICD-10-CM | POA: Diagnosis not present

## 2017-11-05 DIAGNOSIS — G300 Alzheimer's disease with early onset: Secondary | ICD-10-CM | POA: Diagnosis not present

## 2017-11-05 DIAGNOSIS — Z515 Encounter for palliative care: Secondary | ICD-10-CM | POA: Diagnosis not present

## 2017-11-06 DIAGNOSIS — R1312 Dysphagia, oropharyngeal phase: Secondary | ICD-10-CM | POA: Diagnosis not present

## 2017-11-06 DIAGNOSIS — G300 Alzheimer's disease with early onset: Secondary | ICD-10-CM | POA: Diagnosis not present

## 2017-11-07 DIAGNOSIS — R1312 Dysphagia, oropharyngeal phase: Secondary | ICD-10-CM | POA: Diagnosis not present

## 2017-11-07 DIAGNOSIS — G300 Alzheimer's disease with early onset: Secondary | ICD-10-CM | POA: Diagnosis not present

## 2017-11-10 DIAGNOSIS — G300 Alzheimer's disease with early onset: Secondary | ICD-10-CM | POA: Diagnosis not present

## 2017-11-10 DIAGNOSIS — R1312 Dysphagia, oropharyngeal phase: Secondary | ICD-10-CM | POA: Diagnosis not present

## 2017-11-11 DIAGNOSIS — F331 Major depressive disorder, recurrent, moderate: Secondary | ICD-10-CM | POA: Diagnosis not present

## 2017-11-11 DIAGNOSIS — G300 Alzheimer's disease with early onset: Secondary | ICD-10-CM | POA: Diagnosis not present

## 2017-11-11 DIAGNOSIS — R443 Hallucinations, unspecified: Secondary | ICD-10-CM | POA: Diagnosis not present

## 2017-11-11 DIAGNOSIS — F0281 Dementia in other diseases classified elsewhere with behavioral disturbance: Secondary | ICD-10-CM | POA: Diagnosis not present

## 2017-11-11 DIAGNOSIS — R1312 Dysphagia, oropharyngeal phase: Secondary | ICD-10-CM | POA: Diagnosis not present

## 2017-11-19 DIAGNOSIS — G309 Alzheimer's disease, unspecified: Secondary | ICD-10-CM | POA: Diagnosis not present

## 2017-11-19 DIAGNOSIS — Z515 Encounter for palliative care: Secondary | ICD-10-CM | POA: Diagnosis not present

## 2017-11-20 DIAGNOSIS — Z79899 Other long term (current) drug therapy: Secondary | ICD-10-CM | POA: Diagnosis not present

## 2017-11-24 ENCOUNTER — Encounter: Payer: Medicare HMO | Admitting: Nurse Practitioner

## 2017-11-28 DIAGNOSIS — G309 Alzheimer's disease, unspecified: Secondary | ICD-10-CM | POA: Diagnosis not present

## 2017-11-28 DIAGNOSIS — Z515 Encounter for palliative care: Secondary | ICD-10-CM | POA: Diagnosis not present

## 2017-12-02 DIAGNOSIS — F331 Major depressive disorder, recurrent, moderate: Secondary | ICD-10-CM | POA: Diagnosis not present

## 2017-12-02 DIAGNOSIS — R443 Hallucinations, unspecified: Secondary | ICD-10-CM | POA: Diagnosis not present

## 2017-12-02 DIAGNOSIS — F0281 Dementia in other diseases classified elsewhere with behavioral disturbance: Secondary | ICD-10-CM | POA: Diagnosis not present

## 2017-12-02 DIAGNOSIS — G300 Alzheimer's disease with early onset: Secondary | ICD-10-CM | POA: Diagnosis not present

## 2017-12-04 DIAGNOSIS — R569 Unspecified convulsions: Secondary | ICD-10-CM | POA: Diagnosis not present

## 2017-12-04 DIAGNOSIS — Z79899 Other long term (current) drug therapy: Secondary | ICD-10-CM | POA: Diagnosis not present

## 2017-12-05 DIAGNOSIS — G309 Alzheimer's disease, unspecified: Secondary | ICD-10-CM | POA: Diagnosis not present

## 2017-12-05 DIAGNOSIS — R627 Adult failure to thrive: Secondary | ICD-10-CM | POA: Diagnosis not present

## 2017-12-05 DIAGNOSIS — F0281 Dementia in other diseases classified elsewhere with behavioral disturbance: Secondary | ICD-10-CM | POA: Diagnosis not present

## 2017-12-05 DIAGNOSIS — Z9181 History of falling: Secondary | ICD-10-CM | POA: Diagnosis not present

## 2017-12-05 DIAGNOSIS — R634 Abnormal weight loss: Secondary | ICD-10-CM | POA: Diagnosis not present

## 2017-12-05 DIAGNOSIS — F419 Anxiety disorder, unspecified: Secondary | ICD-10-CM | POA: Diagnosis not present

## 2017-12-05 DIAGNOSIS — F329 Major depressive disorder, single episode, unspecified: Secondary | ICD-10-CM | POA: Diagnosis not present

## 2017-12-05 DIAGNOSIS — R1310 Dysphagia, unspecified: Secondary | ICD-10-CM | POA: Diagnosis not present

## 2017-12-10 DIAGNOSIS — G300 Alzheimer's disease with early onset: Secondary | ICD-10-CM | POA: Diagnosis not present

## 2017-12-11 DIAGNOSIS — G300 Alzheimer's disease with early onset: Secondary | ICD-10-CM | POA: Diagnosis not present

## 2017-12-12 DIAGNOSIS — G300 Alzheimer's disease with early onset: Secondary | ICD-10-CM | POA: Diagnosis not present

## 2017-12-13 DIAGNOSIS — G300 Alzheimer's disease with early onset: Secondary | ICD-10-CM | POA: Diagnosis not present

## 2017-12-14 DIAGNOSIS — G300 Alzheimer's disease with early onset: Secondary | ICD-10-CM | POA: Diagnosis not present

## 2017-12-15 DIAGNOSIS — G300 Alzheimer's disease with early onset: Secondary | ICD-10-CM | POA: Diagnosis not present

## 2017-12-16 DIAGNOSIS — G300 Alzheimer's disease with early onset: Secondary | ICD-10-CM | POA: Diagnosis not present

## 2017-12-17 DIAGNOSIS — F0280 Dementia in other diseases classified elsewhere without behavioral disturbance: Secondary | ICD-10-CM | POA: Diagnosis not present

## 2017-12-17 DIAGNOSIS — R569 Unspecified convulsions: Secondary | ICD-10-CM | POA: Diagnosis not present

## 2017-12-17 DIAGNOSIS — G301 Alzheimer's disease with late onset: Secondary | ICD-10-CM | POA: Diagnosis not present

## 2017-12-17 DIAGNOSIS — G300 Alzheimer's disease with early onset: Secondary | ICD-10-CM | POA: Diagnosis not present

## 2017-12-18 DIAGNOSIS — G300 Alzheimer's disease with early onset: Secondary | ICD-10-CM | POA: Diagnosis not present

## 2017-12-19 DIAGNOSIS — G300 Alzheimer's disease with early onset: Secondary | ICD-10-CM | POA: Diagnosis not present

## 2017-12-20 DIAGNOSIS — G300 Alzheimer's disease with early onset: Secondary | ICD-10-CM | POA: Diagnosis not present

## 2017-12-21 DIAGNOSIS — G300 Alzheimer's disease with early onset: Secondary | ICD-10-CM | POA: Diagnosis not present

## 2017-12-22 ENCOUNTER — Other Ambulatory Visit: Payer: Self-pay

## 2017-12-22 DIAGNOSIS — G300 Alzheimer's disease with early onset: Secondary | ICD-10-CM | POA: Diagnosis not present

## 2017-12-22 MED ORDER — CITALOPRAM HYDROBROMIDE 20 MG PO TABS: ORAL_TABLET | ORAL | 1 refills | Status: DC

## 2017-12-22 MED ORDER — SIMVASTATIN 40 MG PO TABS: 40.0000 mg | ORAL_TABLET | Freq: Every day | ORAL | 1 refills | Status: AC

## 2017-12-22 NOTE — Telephone Encounter (Signed)
Faxed pres to [place] [phone]

## 2017-12-23 DIAGNOSIS — G300 Alzheimer's disease with early onset: Secondary | ICD-10-CM | POA: Diagnosis not present

## 2017-12-24 ENCOUNTER — Telehealth: Payer: Self-pay | Admitting: Internal Medicine

## 2017-12-24 DIAGNOSIS — G300 Alzheimer's disease with early onset: Secondary | ICD-10-CM | POA: Diagnosis not present

## 2017-12-24 NOTE — Telephone Encounter (Signed)
Tried contacting patient husband regarding paperwork. Per Dr Humphrey Rolls she feels that in patient best interest she needs to follow the facility providers rather then our office due to patient decline in dementia and for any future emergencies their providers and staff would best be able to treat patient since they are there on a daily basis if anything new were to arise.  I called [facility] and left a voice message with the memory care. Beth

## 2017-12-25 DIAGNOSIS — G300 Alzheimer's disease with early onset: Secondary | ICD-10-CM | POA: Diagnosis not present

## 2017-12-26 DIAGNOSIS — E7849 Other hyperlipidemia: Secondary | ICD-10-CM | POA: Diagnosis not present

## 2017-12-26 DIAGNOSIS — R918 Other nonspecific abnormal finding of lung field: Secondary | ICD-10-CM | POA: Diagnosis not present

## 2017-12-26 DIAGNOSIS — S2242XA Multiple fractures of ribs, left side, initial encounter for closed fracture: Secondary | ICD-10-CM | POA: Diagnosis not present

## 2017-12-26 DIAGNOSIS — M25551 Pain in right hip: Secondary | ICD-10-CM | POA: Diagnosis not present

## 2017-12-26 DIAGNOSIS — M1990 Unspecified osteoarthritis, unspecified site: Secondary | ICD-10-CM | POA: Diagnosis not present

## 2017-12-26 DIAGNOSIS — G301 Alzheimer's disease with late onset: Secondary | ICD-10-CM | POA: Diagnosis not present

## 2017-12-26 DIAGNOSIS — D649 Anemia, unspecified: Secondary | ICD-10-CM | POA: Diagnosis not present

## 2017-12-26 DIAGNOSIS — F331 Major depressive disorder, recurrent, moderate: Secondary | ICD-10-CM | POA: Diagnosis not present

## 2017-12-26 DIAGNOSIS — N390 Urinary tract infection, site not specified: Secondary | ICD-10-CM | POA: Diagnosis not present

## 2017-12-26 DIAGNOSIS — Z7189 Other specified counseling: Secondary | ICD-10-CM | POA: Diagnosis not present

## 2017-12-26 DIAGNOSIS — W19XXXD Unspecified fall, subsequent encounter: Secondary | ICD-10-CM | POA: Diagnosis not present

## 2017-12-26 DIAGNOSIS — Z9183 Wandering in diseases classified elsewhere: Secondary | ICD-10-CM | POA: Diagnosis not present

## 2017-12-26 DIAGNOSIS — Y92239 Unspecified place in hospital as the place of occurrence of the external cause: Secondary | ICD-10-CM | POA: Diagnosis not present

## 2017-12-26 DIAGNOSIS — F05 Delirium due to known physiological condition: Secondary | ICD-10-CM | POA: Diagnosis not present

## 2017-12-26 DIAGNOSIS — G300 Alzheimer's disease with early onset: Secondary | ICD-10-CM | POA: Diagnosis not present

## 2017-12-26 DIAGNOSIS — F0281 Dementia in other diseases classified elsewhere with behavioral disturbance: Secondary | ICD-10-CM | POA: Diagnosis not present

## 2017-12-26 DIAGNOSIS — K5900 Constipation, unspecified: Secondary | ICD-10-CM | POA: Diagnosis not present

## 2017-12-26 DIAGNOSIS — A491 Streptococcal infection, unspecified site: Secondary | ICD-10-CM | POA: Diagnosis not present

## 2017-12-26 DIAGNOSIS — Z66 Do not resuscitate: Secondary | ICD-10-CM | POA: Diagnosis not present

## 2017-12-26 DIAGNOSIS — R443 Hallucinations, unspecified: Secondary | ICD-10-CM | POA: Diagnosis not present

## 2017-12-26 DIAGNOSIS — F0391 Unspecified dementia with behavioral disturbance: Secondary | ICD-10-CM | POA: Diagnosis not present

## 2017-12-26 DIAGNOSIS — Z01818 Encounter for other preprocedural examination: Secondary | ICD-10-CM | POA: Diagnosis not present

## 2017-12-26 DIAGNOSIS — E876 Hypokalemia: Secondary | ICD-10-CM | POA: Diagnosis not present

## 2017-12-26 DIAGNOSIS — B954 Other streptococcus as the cause of diseases classified elsewhere: Secondary | ICD-10-CM | POA: Diagnosis not present

## 2017-12-31 ENCOUNTER — Telehealth: Payer: Self-pay

## 2017-12-31 NOTE — Telephone Encounter (Signed)
Dr Humphrey Rolls signed hospice orders and put in folder to be picked up, also faxed.Beth

## 2018-01-10 ENCOUNTER — Emergency Department
Admission: EM | Admit: 2018-01-10 | Discharge: 2018-01-11 | Disposition: A | Discharge status: Other Acute Inpatient Hospital | Payer: Medicare HMO | Attending: Emergency Medicine | Admitting: Emergency Medicine

## 2018-01-10 DIAGNOSIS — Z79899 Other long term (current) drug therapy: Secondary | ICD-10-CM | POA: Diagnosis not present

## 2018-01-10 DIAGNOSIS — G308 Other Alzheimer's disease: Secondary | ICD-10-CM | POA: Diagnosis not present

## 2018-01-10 DIAGNOSIS — Y92129 Unspecified place in nursing home as the place of occurrence of the external cause: Secondary | ICD-10-CM | POA: Insufficient documentation

## 2018-01-10 DIAGNOSIS — S065X9A Traumatic subdural hemorrhage with loss of consciousness of unspecified duration, initial encounter: Secondary | ICD-10-CM

## 2018-01-10 DIAGNOSIS — F0391 Unspecified dementia with behavioral disturbance: Secondary | ICD-10-CM | POA: Diagnosis not present

## 2018-01-10 DIAGNOSIS — Y939 Activity, unspecified: Secondary | ICD-10-CM | POA: Diagnosis not present

## 2018-01-10 DIAGNOSIS — F0281 Dementia in other diseases classified elsewhere with behavioral disturbance: Secondary | ICD-10-CM | POA: Diagnosis not present

## 2018-01-10 DIAGNOSIS — Z7982 Long term (current) use of aspirin: Secondary | ICD-10-CM | POA: Diagnosis not present

## 2018-01-10 DIAGNOSIS — R000 Tachycardia, unspecified: Secondary | ICD-10-CM | POA: Diagnosis not present

## 2018-01-10 DIAGNOSIS — Y999 Unspecified external cause status: Secondary | ICD-10-CM | POA: Diagnosis not present

## 2018-01-10 DIAGNOSIS — S0990XA Unspecified injury of head, initial encounter: Secondary | ICD-10-CM | POA: Diagnosis not present

## 2018-01-10 DIAGNOSIS — W19XXXA Unspecified fall, initial encounter: Secondary | ICD-10-CM | POA: Diagnosis not present

## 2018-01-10 NOTE — ED Triage Notes (Signed)
Pt sustained unwitnessed fall at care facility, [facility]. Pt found in bathroom, unknown LoC, scalp laceration to R side and periorbital bruising R eye. Pt has hx of dementia and early onset Alzheimers. Pt has 4 mm laceration, bleeding controlled at this time. Pt is normally ambulatory.Pt in no acute distress at this time.

## 2018-01-11 ENCOUNTER — Emergency Department: Payer: Medicare HMO

## 2018-01-11 ENCOUNTER — Other Ambulatory Visit: Payer: Self-pay

## 2018-01-11 DIAGNOSIS — F0280 Dementia in other diseases classified elsewhere without behavioral disturbance: Secondary | ICD-10-CM | POA: Diagnosis not present

## 2018-01-11 DIAGNOSIS — G309 Alzheimer's disease, unspecified: Secondary | ICD-10-CM | POA: Diagnosis not present

## 2018-01-11 DIAGNOSIS — Z7982 Long term (current) use of aspirin: Secondary | ICD-10-CM | POA: Diagnosis not present

## 2018-01-11 DIAGNOSIS — I6201 Nontraumatic acute subdural hemorrhage: Secondary | ICD-10-CM | POA: Diagnosis not present

## 2018-01-11 DIAGNOSIS — I619 Nontraumatic intracerebral hemorrhage, unspecified: Secondary | ICD-10-CM | POA: Diagnosis not present

## 2018-01-11 DIAGNOSIS — S0993XA Unspecified injury of face, initial encounter: Secondary | ICD-10-CM | POA: Diagnosis not present

## 2018-01-11 DIAGNOSIS — I6203 Nontraumatic chronic subdural hemorrhage: Secondary | ICD-10-CM | POA: Diagnosis not present

## 2018-01-11 DIAGNOSIS — S065X0A Traumatic subdural hemorrhage without loss of consciousness, initial encounter: Secondary | ICD-10-CM | POA: Diagnosis not present

## 2018-01-11 DIAGNOSIS — S199XXA Unspecified injury of neck, initial encounter: Secondary | ICD-10-CM | POA: Diagnosis not present

## 2018-01-11 DIAGNOSIS — Z66 Do not resuscitate: Secondary | ICD-10-CM | POA: Diagnosis not present

## 2018-01-11 DIAGNOSIS — E785 Hyperlipidemia, unspecified: Secondary | ICD-10-CM | POA: Diagnosis not present

## 2018-01-11 DIAGNOSIS — S0083XA Contusion of other part of head, initial encounter: Secondary | ICD-10-CM | POA: Diagnosis not present

## 2018-01-11 DIAGNOSIS — S065X9A Traumatic subdural hemorrhage with loss of consciousness of unspecified duration, initial encounter: Secondary | ICD-10-CM | POA: Diagnosis not present

## 2018-01-11 DIAGNOSIS — M47812 Spondylosis without myelopathy or radiculopathy, cervical region: Secondary | ICD-10-CM | POA: Diagnosis not present

## 2018-01-11 LAB — URINALYSIS, ROUTINE W REFLEX MICROSCOPIC
BILIRUBIN URINE: NEGATIVE
GLUCOSE, UA: NEGATIVE mg/dL
HGB URINE DIPSTICK: NEGATIVE
KETONES UR: 20 mg/dL — AB
Leukocytes, UA: NEGATIVE
NITRITE: NEGATIVE
Protein, ur: 30 mg/dL — AB
Specific Gravity, Urine: 1.028 (ref 1.005–1.030)
pH: 5 (ref 5.0–8.0)

## 2018-01-11 LAB — CBC WITH DIFFERENTIAL/PLATELET
Abs Immature Granulocytes: 0.02 10*3/uL (ref 0.00–0.07)
Basophils Absolute: 0 10*3/uL (ref 0.0–0.1)
Basophils Relative: 0 %
EOS PCT: 1 %
Eosinophils Absolute: 0.1 10*3/uL (ref 0.0–0.5)
HEMATOCRIT: 31.6 % — AB (ref 36.0–46.0)
Hemoglobin: 10 g/dL — ABNORMAL LOW (ref 12.0–15.0)
IMMATURE GRANULOCYTES: 0 %
LYMPHS ABS: 0.9 10*3/uL (ref 0.7–4.0)
Lymphocytes Relative: 16 %
MCH: 28.7 pg (ref 26.0–34.0)
MCHC: 31.6 g/dL (ref 30.0–36.0)
MCV: 90.5 fL (ref 80.0–100.0)
MONO ABS: 0.6 10*3/uL (ref 0.1–1.0)
MONOS PCT: 11 %
Neutro Abs: 4 10*3/uL (ref 1.7–7.7)
Neutrophils Relative %: 72 %
Platelets: 305 10*3/uL (ref 150–400)
RBC: 3.49 MIL/uL — ABNORMAL LOW (ref 3.87–5.11)
RDW: 15.3 % (ref 11.5–15.5)
WBC: 5.7 10*3/uL (ref 4.0–10.5)
nRBC: 0 % (ref 0.0–0.2)

## 2018-01-11 LAB — BASIC METABOLIC PANEL
ANION GAP: 8 (ref 5–15)
BUN: 26 mg/dL — ABNORMAL HIGH (ref 8–23)
CO2: 29 mmol/L (ref 22–32)
Calcium: 8.6 mg/dL — ABNORMAL LOW (ref 8.9–10.3)
Chloride: 101 mmol/L (ref 98–111)
Creatinine, Ser: 0.41 mg/dL — ABNORMAL LOW (ref 0.44–1.00)
GFR calc Af Amer: >60 mL/min (ref >60)
GFR calc non Af Amer: >60 mL/min (ref >60)
GLUCOSE: 94 mg/dL (ref 70–99)
Potassium: 3.4 mmol/L — ABNORMAL LOW (ref 3.5–5.1)
Sodium: 138 mmol/L (ref 135–145)

## 2018-01-11 LAB — MAGNESIUM: Magnesium: 2.4 mg/dL (ref 1.7–2.4)

## 2018-01-11 LAB — TROPONIN I: Troponin I: <0.03 ng/mL (ref <0.03)

## 2018-01-11 LAB — VALPROIC ACID LEVEL: Valproic Acid Lvl: 20 ug/mL — ABNORMAL LOW (ref 50.0–100.0)

## 2018-01-11 MED ORDER — LEVETIRACETAM IN NACL 1000 MG/100ML IV SOLN
1000.0000 mg | INTRAVENOUS | Status: AC
  Administered 2018-01-11: 1000 mg via INTRAVENOUS
  Filled 2018-01-11: qty 100

## 2018-01-11 MED ORDER — SODIUM CHLORIDE 0.9 % IV BOLUS
500.0000 mL | Freq: Once | INTRAVENOUS | Status: AC
  Administered 2018-01-11: 500 mL via INTRAVENOUS

## 2018-01-11 NOTE — ED Provider Notes (Signed)
[facility] Emergency Department Provider Note  ____________________________________________   First MD Initiated Contact with Patient 01/11/18 [phone]     (approximate)  I have reviewed the triage vital signs and the nursing notes.   HISTORY  Chief Complaint Fall and Head Laceration  Level 5 caveat:  history/ROS limited by chronic dementia and/or acute injury  HPI Joan Peters is a 72 y.o. female with medical history as listed below which notably includes advanced Alzheimer's dementia and who does come with DNR paperwork.  She presents after an unwitnessed fall at her memory unit facility.  She has a small laceration to the right side of her scalp as well as some bruising on her face and forehead.  She is oriented to her own name only and denies any any additional history.  She speaks and tries to form coherent sentences but is not able to speak clearly or articulate her thoughts clearly.  She is not in any distress at this time although when she first arrived she was rather agitated.   Past Medical History:  Diagnosis Date  . Allergy   . Alzheimer disease   . Anxiety   . Dementia   . Depression   . Hyperlipidemia   . Migraines   . Petit mal seizure status [facility])     Patient Active Problem List   Diagnosis Date Noted  . Major depression, chronic 09/19/2017  . Mixed hyperlipidemia 06/15/2017  . Anxiety 06/15/2017  . Alzheimer disease ([place]) 06/15/2017  . Osteoporosis of multiple sites 06/15/2017    Past Surgical History:  Procedure Laterality Date  . ABDOMINAL HYSTERECTOMY    . CATARACT EXTRACTION    . CESAREAN SECTION    . right shoulder surgery      Prior to Admission medications   Medication Sig Start Date End Date Taking? Authorizing Provider  acetaminophen (TYLENOL) 325 MG tablet Take 650 mg by mouth 3 (three) times daily.   Yes [provider]  alendronate (FOSAMAX) 70 MG tablet TAKE 1 TABLET ONE TIME WEEKLY 05/13/17  Yes  Ronnell Freshwater, NP  aspirin 81 MG chewable tablet Chew 81 mg by mouth daily.    Yes [provider]  citalopram (CELEXA) 20 MG tablet Take 20 mg by mouth once daily. Patient taking differently: Take 10 mg by mouth daily.  12/22/17  Yes Boscia, Heather E, NP  divalproex (DEPAKOTE) 125 MG DR tablet Take 250 mg by mouth daily.    Yes [provider]  meloxicam (MOBIC) 7.5 MG tablet Take 7.5 mg by mouth daily.  11/27/16  Yes [provider]  mirtazapine (REMERON) 15 MG tablet Take 15 mg by mouth at bedtime.   Yes [provider]  QUEtiapine (SEROQUEL) 50 MG tablet Take 50 mg by mouth 3 (three) times daily. May take 1 tablet by mouth additional at bedtime as needed.   Yes [provider]  risperiDONE (RISPERDAL) 1 MG tablet Take 1 tablet by mouth 3 (three) times daily. 01/06/18  Yes [provider]  sennosides (SENOKOT) 8.8 MG/5ML syrup Take 5 mLs by mouth daily. 01/07/18 02/06/18 Yes [provider]  simvastatin (ZOCOR) 40 MG tablet Take 1 tablet (40 mg total) by mouth at bedtime. 12/22/17  Yes Ronnell Freshwater, NP  traZODone (DESYREL) 50 MG tablet Take 1 tablet by mouth at bedtime as needed for sleep. 01/06/18  Yes [provider]  Phosphatidylserine-DHA-EPA 100-19.5-6.5 MG CAPS Take 1 capsule by mouth daily.    [provider]  Allergies Sulfa antibiotics  No family history on file.  Social History Social History   Tobacco Use  . Smoking status: Never Smoker  . Smokeless tobacco: Never Used  Substance Use Topics  . Alcohol use: Yes  . Drug use: No    Review of Systems Level 5 caveat:  history/ROS limited by chronic dementia and/or acute injury  ____________________________________________   PHYSICAL EXAM:  VITAL SIGNS: ED Triage Vitals  Enc Vitals Group     BP 01/10/18 2356 133/79     Pulse Rate 01/10/18 2356 84     Resp 01/10/18 2356 20     Temp 01/10/18 2356 98.5 F (36.9 C)     Temp Source  01/10/18 2356 Oral     SpO2 01/10/18 2350 94 %     Weight 01/11/18 0000 49.9 kg (110 lb)     Height 01/11/18 0000 1.6 m (5\' 3" )     Head Circumference --      Peak Flow --      Pain Score --      Pain Loc --      Pain Edu? --      Excl. in [place]? --     Constitutional: Alert, oriented to self only.  No distress.  Appears chronically debilitated. Eyes: Conjunctivae are normal. PERRL. EOMI. Head: Bruising and hematoma to forehead.  Small laceration to right side of scalp, no active bleeding. Nose: No congestion/rhinnorhea. Mouth/Throat: Mucous membranes are moist. Neck: No stridor.  No meningeal signs.  No cervical spine tenderness to palpation. Cardiovascular: Normal rate, regular rhythm. Good peripheral circulation. Grossly normal heart sounds. Respiratory: Normal respiratory effort.  No retractions. Lungs CTAB. Gastrointestinal: Soft and nontender. No distention.  Musculoskeletal: No lower extremity tenderness nor edema. No gross deformities of extremities. Neurologic:  Unable to participate in extensive neuro exam, but able to grip bilaterally and moving all four extremities.  Speech is garbled and non-sensical, but I personally verified by phone with the nursing facility that this is her baseline.   Skin:  Skin is warm, dry and intact except for small scalp laceration as documented above. Psychiatric: Mood and affect are normal. Speech and behavior are normal.  ____________________________________________   LABS (all labs ordered are listed, but only abnormal results are displayed)  Labs Reviewed  URINALYSIS, ROUTINE W REFLEX MICROSCOPIC - Abnormal; Notable for the following components:      Result Value   Color, Urine AMBER (*)    APPearance CLEAR (*)    Ketones, ur 20 (*)    Protein, ur 30 (*)    Bacteria, UA RARE (*)    All other components within normal limits  VALPROIC ACID LEVEL - Abnormal; Notable for the following components:   Valproic Acid Lvl 20 (*)    All other  components within normal limits  CBC WITH DIFFERENTIAL/PLATELET - Abnormal; Notable for the following components:   RBC 3.49 (*)    Hemoglobin 10.0 (*)    HCT 31.6 (*)    All other components within normal limits  BASIC METABOLIC PANEL - Abnormal; Notable for the following components:   Potassium 3.4 (*)    BUN 26 (*)    Creatinine, Ser 0.41 (*)    Calcium 8.6 (*)    All other components within normal limits  URINE CULTURE  MAGNESIUM  TROPONIN I   ____________________________________________  EKG  ED ECG REPORT I, Hinda Kehr, the attending physician, personally viewed and interpreted this ECG.  Date: 01/11/2018 EKG Time: 00: 21  Rate: 164 Rhythm: Sinus tachycardia QRS Axis: Difficult to interpret Intervals: Difficult to interpret ST/T Wave abnormalities: Difficult to interpret Narrative Interpretation: There is so much artifact due to the patient's agitation and motion that this EKG is essentially uninterpretable.  We will repeat after the patient is calm.  ED ECG REPORT I, Hinda Kehr, the attending physician, personally viewed and interpreted this ECG.  Date: 01/11/2018 EKG Time: 3:07 AM Rate: 85 Rhythm: normal sinus rhythm QRS Axis: normal Intervals: normal ST/T Wave abnormalities: Non-specific ST segment / T-wave changes, but no evidence of acute ischemia. Narrative Interpretation: no evidence of acute ischemia      ____________________________________________  RADIOLOGY   ED MD interpretation:  Acute subdural hemorrhage in the setting of a chronic hygroma  Official radiology report(s): Ct Head Wo Contrast  Result Date: 01/11/2018 CLINICAL DATA:  72 year old female with history of unwitnessed fall. Scalp laceration on the right side with periorbital bruising around the right eye. EXAM: CT HEAD WITHOUT CONTRAST CT MAXILLOFACIAL WITHOUT CONTRAST CT CERVICAL SPINE WITHOUT CONTRAST TECHNIQUE: Multidetector CT imaging of the head, cervical spine, and  maxillofacial structures were performed using the standard protocol without intravenous contrast. Multiplanar CT image reconstructions of the cervical spine and maxillofacial structures were also generated. COMPARISON:  CT of the head and cervical spine 11/02/2017. FINDINGS: CT HEAD FINDINGS Brain: When compared to the prior study from November 02, 2017, the previously noted left-sided subdural hygroma now demonstrates internal areas of higher attenuation, indicative of subdural hemorrhage. This ranges from intermediate to high attenuation, suggesting blood products of different ages, likely both acute and subacute to chronic. This measures up to 9 mm in thickness, and exerts only mild mass effect upon the underlying brain. Moderate cerebral atrophy. Patchy and confluent areas of decreased attenuation are noted throughout the deep and periventricular white matter of the cerebral hemispheres bilaterally, compatible with chronic microvascular ischemic disease. Ex vacuo dilatation of the ventricular system. Vascular: No hyperdense vessel or unexpected calcification. Skull: Normal. Negative for fracture or focal lesion. Other: None. CT MAXILLOFACIAL FINDINGS Osseous: No fracture or mandibular dislocation. No destructive process. Orbits: Negative. No traumatic or inflammatory finding. Sinuses: Clear. Soft tissues: Mild soft tissue swelling in the right periorbital region. CT CERVICAL SPINE FINDINGS Alignment: Normal. Skull base and vertebrae: No acute fracture. No primary bone lesion or focal pathologic process. Soft tissues and spinal canal: No prevertebral fluid or swelling. No visible canal hematoma. Disc levels: Multilevel degenerative disc disease, most severe at C5-C6 and C6-C7. Mild multilevel facet arthropathy. Upper chest: Unremarkable. Other: None. IMPRESSION: 1. New left-sided subdural hematoma with heterogeneous attenuation suggesting blood products of various phases (both acute and subacute/chronic). This is  roughly the same size as the previously noted subdural hygroma, exerting only mild mass effect upon the left cerebral hemisphere. 2. Small amount of right periorbital soft tissue swelling. No underlying displaced facial bone fractures. 3. No evidence of acute traumatic injury to the cervical spine. 4. Moderate cerebral atrophy with mild chronic microvascular ischemic changes in the cerebral white matter. Electronically Signed   By: Vinnie Langton M.D.   On: 01/11/2018 01:43   Ct Cervical Spine Wo Contrast  Result Date: 01/11/2018 CLINICAL DATA:  72 year old female with history of unwitnessed fall. Scalp laceration on the right side with periorbital bruising around the right eye. EXAM: CT HEAD WITHOUT CONTRAST CT MAXILLOFACIAL WITHOUT CONTRAST CT CERVICAL SPINE WITHOUT CONTRAST TECHNIQUE: Multidetector CT imaging of the head, cervical spine, and maxillofacial structures were performed using the standard protocol without  intravenous contrast. Multiplanar CT image reconstructions of the cervical spine and maxillofacial structures were also generated. COMPARISON:  CT of the head and cervical spine 11/02/2017. FINDINGS: CT HEAD FINDINGS Brain: When compared to the prior study from November 02, 2017, the previously noted left-sided subdural hygroma now demonstrates internal areas of higher attenuation, indicative of subdural hemorrhage. This ranges from intermediate to high attenuation, suggesting blood products of different ages, likely both acute and subacute to chronic. This measures up to 9 mm in thickness, and exerts only mild mass effect upon the underlying brain. Moderate cerebral atrophy. Patchy and confluent areas of decreased attenuation are noted throughout the deep and periventricular white matter of the cerebral hemispheres bilaterally, compatible with chronic microvascular ischemic disease. Ex vacuo dilatation of the ventricular system. Vascular: No hyperdense vessel or unexpected calcification. Skull:  Normal. Negative for fracture or focal lesion. Other: None. CT MAXILLOFACIAL FINDINGS Osseous: No fracture or mandibular dislocation. No destructive process. Orbits: Negative. No traumatic or inflammatory finding. Sinuses: Clear. Soft tissues: Mild soft tissue swelling in the right periorbital region. CT CERVICAL SPINE FINDINGS Alignment: Normal. Skull base and vertebrae: No acute fracture. No primary bone lesion or focal pathologic process. Soft tissues and spinal canal: No prevertebral fluid or swelling. No visible canal hematoma. Disc levels: Multilevel degenerative disc disease, most severe at C5-C6 and C6-C7. Mild multilevel facet arthropathy. Upper chest: Unremarkable. Other: None. IMPRESSION: 1. New left-sided subdural hematoma with heterogeneous attenuation suggesting blood products of various phases (both acute and subacute/chronic). This is roughly the same size as the previously noted subdural hygroma, exerting only mild mass effect upon the left cerebral hemisphere. 2. Small amount of right periorbital soft tissue swelling. No underlying displaced facial bone fractures. 3. No evidence of acute traumatic injury to the cervical spine. 4. Moderate cerebral atrophy with mild chronic microvascular ischemic changes in the cerebral white matter. Electronically Signed   By: Vinnie Langton M.D.   On: 01/11/2018 01:43   Ct Maxillofacial Wo Contrast  Result Date: 01/11/2018 CLINICAL DATA:  72 year old female with history of unwitnessed fall. Scalp laceration on the right side with periorbital bruising around the right eye. EXAM: CT HEAD WITHOUT CONTRAST CT MAXILLOFACIAL WITHOUT CONTRAST CT CERVICAL SPINE WITHOUT CONTRAST TECHNIQUE: Multidetector CT imaging of the head, cervical spine, and maxillofacial structures were performed using the standard protocol without intravenous contrast. Multiplanar CT image reconstructions of the cervical spine and maxillofacial structures were also generated. COMPARISON:  CT  of the head and cervical spine 11/02/2017. FINDINGS: CT HEAD FINDINGS Brain: When compared to the prior study from November 02, 2017, the previously noted left-sided subdural hygroma now demonstrates internal areas of higher attenuation, indicative of subdural hemorrhage. This ranges from intermediate to high attenuation, suggesting blood products of different ages, likely both acute and subacute to chronic. This measures up to 9 mm in thickness, and exerts only mild mass effect upon the underlying brain. Moderate cerebral atrophy. Patchy and confluent areas of decreased attenuation are noted throughout the deep and periventricular white matter of the cerebral hemispheres bilaterally, compatible with chronic microvascular ischemic disease. Ex vacuo dilatation of the ventricular system. Vascular: No hyperdense vessel or unexpected calcification. Skull: Normal. Negative for fracture or focal lesion. Other: None. CT MAXILLOFACIAL FINDINGS Osseous: No fracture or mandibular dislocation. No destructive process. Orbits: Negative. No traumatic or inflammatory finding. Sinuses: Clear. Soft tissues: Mild soft tissue swelling in the right periorbital region. CT CERVICAL SPINE FINDINGS Alignment: Normal. Skull base and vertebrae: No acute fracture. No primary bone  lesion or focal pathologic process. Soft tissues and spinal canal: No prevertebral fluid or swelling. No visible canal hematoma. Disc levels: Multilevel degenerative disc disease, most severe at C5-C6 and C6-C7. Mild multilevel facet arthropathy. Upper chest: Unremarkable. Other: None. IMPRESSION: 1. New left-sided subdural hematoma with heterogeneous attenuation suggesting blood products of various phases (both acute and subacute/chronic). This is roughly the same size as the previously noted subdural hygroma, exerting only mild mass effect upon the left cerebral hemisphere. 2. Small amount of right periorbital soft tissue swelling. No underlying displaced facial bone  fractures. 3. No evidence of acute traumatic injury to the cervical spine. 4. Moderate cerebral atrophy with mild chronic microvascular ischemic changes in the cerebral white matter. Electronically Signed   By: Vinnie Langton M.D.   On: 01/11/2018 01:43    ____________________________________________   PROCEDURES  Critical Care performed: Yes, see critical care procedure note(s)   Procedure(s) performed:   .Critical Care Performed by: Hinda Kehr, MD Authorized by: Hinda Kehr, MD   Critical care provider statement:    Critical care time (minutes):  30   Critical care time was exclusive of:  Separately billable procedures and treating other patients   Critical care was necessary to treat or prevent imminent or life-threatening deterioration of the following conditions:  Trauma and CNS failure or compromise (acute subdural hemorrhage)   Critical care was time spent personally by me on the following activities:  Development of treatment plan with patient or surrogate, discussions with consultants, evaluation of patient's response to treatment, examination of patient, obtaining history from patient or surrogate, ordering and performing treatments and interventions, ordering and review of laboratory studies, ordering and review of radiographic studies, pulse oximetry, re-evaluation of patient's condition and review of old charts     ____________________________________________   INITIAL IMPRESSION / ASSESSMENT AND PLAN / ED COURSE  As part of my medical decision making, I reviewed the following data within the [place] notes reviewed and incorporated, Labs reviewed , EKG interpreted , Old chart reviewed, Discussed with nursing home representative, tried (unsuccessfully) to call spouse/HCPOA, Discussed with accepting physician at [facility],  and reviewed Notes from prior ED visits    Differential diagnosis includes, but is not limited to, subdural hemorrhage,  epidural hemorrhage, skull fracture, subarachnoid hemorrhage, scalp contusion.  Causes of fall could include acute infection such as urinary tract infection, metabolic or electrolyte abnormality, general failure to thrive and malnutrition, dehydration, kidney failure, etc.  I ordered CT scans of the head, maxillofacial, and cervical spine.  The cervical spine and maxillofacial scans are within normal limits but the head CT indicates acute subdural hemorrhage in the setting of an existing hygroma.  I contacted the nursing facility and discussed her baseline with them and she appears to be at the baseline in terms of an inability to complete a coherent sentence.  She has no obvious gross neurological deficits at this time, but she does have a traumatic subdural hemorrhage.  I attempted to call the husband, Juliett Eastburn, at the number in our system and verified that this is the same number used by the facility.  He did not answer and the representative at the facility said that she had called and left him a message earlier but he had not answered at that time either.  He is her healthcare power of attorney and there are no other family members local nor family members for whom the nursing facility has contact information.  The patient does have  a DNR order but she has an acute injury and I do not have the capacity to care for her at this facility.  I have called [facility] to discuss the possibility of transfer for further observation, evaluation, and possible treatment.  Labs notable for subtherpeutic valproic acid level of 20 and ketones in urine.  Providing Keppra 1000 mg IV for seizure prophylaxis and 500 mL NS.   CBC unremarkable, troponin negative.  No complaints of chest pain nor shortness of breath.  Patient is not on any anticoagulation.    Clinical Course as of Jan 11 329  Sun Jan 11, 2018  0246 Spoke with Dr. Evelena Leyden in the [facility] ED who accepted the patient.  Will work on transport.   [CF]    Clinical  Course User Index [CF] Hinda Kehr, MD    ____________________________________________  FINAL CLINICAL IMPRESSION(S) / ED DIAGNOSES  Final diagnoses:  Traumatic subdural hemorrhage with loss of consciousness, initial encounter ([place])  Dementia with behavioral disturbance, unspecified dementia type [facility])     MEDICATIONS GIVEN DURING THIS VISIT:  Medications  sodium chloride 0.9 % bolus 500 mL (500 mLs Intravenous New Bag/Given 01/11/18 0135)  levETIRAcetam (KEPPRA) IVPB 1000 mg/100 mL premix (1,000 mg Intravenous New Bag/Given 01/11/18 5436)     ED Discharge Orders    None       Note:  This document was prepared using Dragon voice recognition software and may include unintentional dictation errors.    Hinda Kehr, MD 01/11/18 0330

## 2018-01-11 NOTE — ED Notes (Signed)
Second IV attempted x 3 at this time by April RN (2) and Apolonio Schneiders RN (1).

## 2018-01-12 LAB — URINE CULTURE
Culture: NO GROWTH
Special Requests: NORMAL

## 2018-01-13 ENCOUNTER — Emergency Department: Payer: Medicare HMO

## 2018-01-13 ENCOUNTER — Emergency Department
Admission: EM | Admit: 2018-01-13 | Discharge: 2018-01-13 | Disposition: A | Discharge status: Home / Self Care | Payer: Medicare HMO | Attending: Emergency Medicine | Admitting: Emergency Medicine

## 2018-01-13 ENCOUNTER — Encounter: Payer: Self-pay | Admitting: Emergency Medicine

## 2018-01-13 DIAGNOSIS — N3000 Acute cystitis without hematuria: Secondary | ICD-10-CM

## 2018-01-13 DIAGNOSIS — W19XXXA Unspecified fall, initial encounter: Secondary | ICD-10-CM | POA: Diagnosis not present

## 2018-01-13 DIAGNOSIS — S6992XA Unspecified injury of left wrist, hand and finger(s), initial encounter: Secondary | ICD-10-CM | POA: Diagnosis not present

## 2018-01-13 DIAGNOSIS — S0083XA Contusion of other part of head, initial encounter: Secondary | ICD-10-CM | POA: Diagnosis not present

## 2018-01-13 DIAGNOSIS — Y92129 Unspecified place in nursing home as the place of occurrence of the external cause: Secondary | ICD-10-CM | POA: Diagnosis not present

## 2018-01-13 DIAGNOSIS — Y999 Unspecified external cause status: Secondary | ICD-10-CM | POA: Insufficient documentation

## 2018-01-13 DIAGNOSIS — R58 Hemorrhage, not elsewhere classified: Secondary | ICD-10-CM | POA: Diagnosis not present

## 2018-01-13 DIAGNOSIS — Z743 Need for continuous supervision: Secondary | ICD-10-CM | POA: Diagnosis not present

## 2018-01-13 DIAGNOSIS — G308 Other Alzheimer's disease: Secondary | ICD-10-CM | POA: Diagnosis not present

## 2018-01-13 DIAGNOSIS — S0990XA Unspecified injury of head, initial encounter: Secondary | ICD-10-CM | POA: Diagnosis not present

## 2018-01-13 DIAGNOSIS — Y939 Activity, unspecified: Secondary | ICD-10-CM | POA: Diagnosis not present

## 2018-01-13 DIAGNOSIS — F0280 Dementia in other diseases classified elsewhere without behavioral disturbance: Secondary | ICD-10-CM | POA: Insufficient documentation

## 2018-01-13 DIAGNOSIS — W0110XA Fall on same level from slipping, tripping and stumbling with subsequent striking against unspecified object, initial encounter: Secondary | ICD-10-CM | POA: Diagnosis not present

## 2018-01-13 DIAGNOSIS — I6200 Nontraumatic subdural hemorrhage, unspecified: Secondary | ICD-10-CM | POA: Diagnosis not present

## 2018-01-13 DIAGNOSIS — R4182 Altered mental status, unspecified: Secondary | ICD-10-CM | POA: Diagnosis not present

## 2018-01-13 DIAGNOSIS — R55 Syncope and collapse: Secondary | ICD-10-CM | POA: Diagnosis not present

## 2018-01-13 LAB — URINALYSIS, COMPLETE (UACMP) WITH MICROSCOPIC
BACTERIA UA: NONE SEEN
Bilirubin Urine: NEGATIVE
Glucose, UA: NEGATIVE mg/dL
Hgb urine dipstick: NEGATIVE
Ketones, ur: 20 mg/dL — AB
Nitrite: NEGATIVE
PROTEIN: 30 mg/dL — AB
SPECIFIC GRAVITY, URINE: 1.029 (ref 1.005–1.030)
pH: 5 (ref 5.0–8.0)

## 2018-01-13 LAB — BASIC METABOLIC PANEL
ANION GAP: 6 (ref 5–15)
BUN: 23 mg/dL (ref 8–23)
CALCIUM: 8.6 mg/dL — AB (ref 8.9–10.3)
CO2: 28 mmol/L (ref 22–32)
CREATININE: 0.41 mg/dL — AB (ref 0.44–1.00)
Chloride: 107 mmol/L (ref 98–111)
GFR calc non Af Amer: >60 mL/min (ref >60)
GLUCOSE: 98 mg/dL (ref 70–99)
Potassium: 3.5 mmol/L (ref 3.5–5.1)
Sodium: 141 mmol/L (ref 135–145)

## 2018-01-13 LAB — CBC
HEMATOCRIT: 32 % — AB (ref 36.0–46.0)
Hemoglobin: 10.1 g/dL — ABNORMAL LOW (ref 12.0–15.0)
MCH: 28.7 pg (ref 26.0–34.0)
MCHC: 31.6 g/dL (ref 30.0–36.0)
MCV: 90.9 fL (ref 80.0–100.0)
NRBC: 0 % (ref 0.0–0.2)
Platelets: 273 10*3/uL (ref 150–400)
RBC: 3.52 MIL/uL — ABNORMAL LOW (ref 3.87–5.11)
RDW: 15.5 % (ref 11.5–15.5)
WBC: 4.8 10*3/uL (ref 4.0–10.5)

## 2018-01-13 LAB — GLUCOSE, CAPILLARY: GLUCOSE-CAPILLARY: 93 mg/dL (ref 70–99)

## 2018-01-13 LAB — TROPONIN I: Troponin I: <0.03 ng/mL (ref <0.03)

## 2018-01-13 MED ORDER — CEPHALEXIN 500 MG PO CAPS: 500.0000 mg | ORAL_CAPSULE | Freq: Four times a day (QID) | ORAL | 0 refills | Status: DC

## 2018-01-13 MED ORDER — CEPHALEXIN 500 MG PO CAPS
500.0000 mg | ORAL_CAPSULE | Freq: Once | ORAL | Status: AC
  Administered 2018-01-13: 500 mg via ORAL
  Filled 2018-01-13: qty 1

## 2018-01-13 NOTE — Discharge Instructions (Addendum)
PLEASE STOP TAKING ASPIRIN UNTIL CLEARED BY YOUR PRIMARY CARE DOCTOR TO RESTART THIS.  These take all precautions to prevent falls.  Today, your urine test looks like you might have a urine infection.  I have started treatment for UTI, but please make sure your doctor follows up with the results of your urine culture.  Take the entire course of antibiotics, even if you are feeling better.  Return to the emergency department if you develop severe pain, lightheadedness or fainting, fever, changes in mental status, or any other symptoms concerning to you.

## 2018-01-13 NOTE — ED Triage Notes (Signed)
Pt arrived via [place] with concerns after a witnessed fall today. PT had another fall Saturday. Saturday pt was transferred to [facility] from [facility] with a diagnosis of subdural hemorrhage. Per facility pt was discharged after confirmation hemorrhage was not acute but an old finding.

## 2018-01-13 NOTE — ED Provider Notes (Addendum)
[facility] Emergency Department Provider Note  ____________________________________________  Time seen: Approximately 9:56 AM  I have reviewed the triage vital signs and the nursing notes.   HISTORY  Chief Complaint Fall    HPI Joan Peters is a 72 y.o. female with recent diagnosis of subdural hematoma, petit mall seizures on antiepileptics, end-stage dementia and an Alzheimer's facility, presenting with a witnessed fall.  The patient is unable to give any history due to her end-stage dementia.  EMS reports that the patient had a witnessed fall from standing height and hit the back of her head.  She did not reportedly lose consciousness.  There was no evidence of seizure activity.  Upon arrival to the emergency department, the patient is hemodynamically stable.  Her spouse is at the bedside and states that she is at her baseline mental status.  She does have multiple areas of trauma, and he states her left forehead bruise and left hand bruises are old but that the ecchymosis around her right eye is new.  The patient's past also reports a recent hospitalization for medication management of agitation attributed to her dementia  Past Medical History:  Diagnosis Date  . Allergy   . Alzheimer disease ([place])   . Anxiety   . Dementia ([place])   . Depression   . Hyperlipidemia   . Migraines   . Petit mal seizure status [facility])     Patient Active Problem List   Diagnosis Date Noted  . Major depression, chronic 09/19/2017  . Mixed hyperlipidemia 06/15/2017  . Anxiety 06/15/2017  . Alzheimer disease ([place]) 06/15/2017  . Osteoporosis of multiple sites 06/15/2017    Past Surgical History:  Procedure Laterality Date  . ABDOMINAL HYSTERECTOMY    . CATARACT EXTRACTION    . CESAREAN SECTION    . right shoulder surgery      Current Outpatient Rx  . Order #: [id number] Class: Historical Med  . Order #: [id number] Class: Normal  . Order #: [id number] Class: Print   . Order #: [id number] Class: Historical Med  . Order #: [id number] Class: Historical Med  . Order #: [id number] Class: Historical Med  . Order #: [id number] Class: Historical Med  . Order #: [id number] Class: Historical Med  . Order #: [id number] Class: Historical Med  . Order #: [id number] Class: Historical Med  . Order #: [id number] Class: Historical Med  . Order #: [id number] Class: Print  . Order #: [id number] Class: Historical Med  . Order #: [id number] Class: Print  . Order #: [id number] Class: Historical Med    Allergies Sulfa antibiotics  No family history on file.  Social History Social History   Tobacco Use  . Smoking status: Never Smoker  . Smokeless tobacco: Never Used  Substance Use Topics  . Alcohol use: Yes  . Drug use: No    Review of Systems Unable to obtain as the patient is demented.   ____________________________________________   PHYSICAL EXAM:  VITAL SIGNS: ED Triage Vitals  Enc Vitals Group     BP 01/13/18 0924 121/85     Pulse --      Resp 01/13/18 0924 20     Temp 01/13/18 0924 98.5 F (36.9 C)     Temp Source 01/13/18 0924 Rectal     SpO2 01/13/18 0924 100 %     Weight 01/13/18 0922 110 lb (49.9 kg)     Height 01/13/18 0922 5\' 3"  (1.6 m)     Head Circumference --      Peak Flow --      Pain Score --  Pain Loc --      Pain Edu? --      Excl. in [place]? --     Constitutional: Patient is alert and protecting her airway, but she is mostly nonverbal and occasionally mumbles a little bit.  GCS is 15. Eyes: Conjunctivae are normal.  EOMI. PERRLA.  No scleral icterus.  The patient has ecchymosis around the right eyebrow and right orbit.  No evidence of entrapment. Head: Patient has purple bruising over the right eyebrow and on the inferior aspect of the right orbit.  The patient has green discoloration consistent with an old bruise over the left forehead with an overlying abrasion.. Nose: No congestion/rhinnorhea.  Swelling over the nose or septal  hematoma. Mouth/Throat: Mucous membranes are moist.  No dental injury or malocclusion. Neck: No stridor.  Supple.  No JVD.  No midline C-spine tenderness to palpation, step-offs or deformities. Cardiovascular: Normal rate, regular rhythm. No murmurs, rubs or gallops.  Respiratory: Normal respiratory effort.  No accessory muscle use or retractions. Lungs CTAB.  No wheezes, rales or ronchi. Gastrointestinal: Soft, nontender and nondistended.  No guarding or rebound.  No peritoneal signs. Musculoskeletal: No LE edema. No ttp in the calves or palpable cords.  Negative Homan's sign.  The patient has a large bruise on the posterior aspect of the right hand that is discolored green and purple, likely old without any obvious deformity. Neurologic:  Alert but mostly nonverbal; mumbles  Face and smile are symmetric.  EOMI.  Moves all extremities well. Skin:  Skin is warm, dry. Psychiatric: No evidence of agitation  ____________________________________________   LABS (all labs ordered are listed, but only abnormal results are displayed)  Labs Reviewed  CBC - Abnormal; Notable for the following components:      Result Value   RBC 3.52 (*)    Hemoglobin 10.1 (*)    HCT 32.0 (*)    All other components within normal limits  BASIC METABOLIC PANEL - Abnormal; Notable for the following components:   Creatinine, Ser 0.41 (*)    Calcium 8.6 (*)    All other components within normal limits  URINALYSIS, COMPLETE (UACMP) WITH MICROSCOPIC - Abnormal; Notable for the following components:   Color, Urine AMBER (*)    APPearance CLOUDY (*)    Ketones, ur 20 (*)    Protein, ur 30 (*)    Leukocytes, UA SMALL (*)    All other components within normal limits  URINE CULTURE  TROPONIN I  GLUCOSE, CAPILLARY  CBG MONITORING, ED   ____________________________________________  EKG  ED ECG REPORT I, Anne-Caroline Mariea Clonts, the attending physician, personally viewed and interpreted this ECG.   Date:  01/13/2018  EKG Time: 927  Rate: 79  Rhythm: normal sinus rhythm  Axis: normal  Intervals:none  ST&T Change: No STEMI; poor baseline tracing.  ____________________________________________  RADIOLOGY  Ct Head Wo Contrast  Result Date: 01/13/2018 CLINICAL DATA:  Recent falls. Subdural hematoma. EXAM: CT HEAD WITHOUT CONTRAST TECHNIQUE: Contiguous axial images were obtained from the base of the skull through the vertex without intravenous contrast. COMPARISON:  01/11/2018 FINDINGS: Brain: Subdural collection over the left cerebral convexity has not significantly changed in size and measures up to 9 mm in thickness. The collection is predominantly hypoattenuating though with similar appearance of scattered small volume intermediate to hyperattenuating blood products, possibly with some associated dural thickening as well. Minimal mass effect on the underlying brain is unchanged, and there is no midline shift. There is moderate cerebral atrophy. No acute  infarct, new hemorrhage, or mass is identified. Vascular: No hyperdense vessel. Skull: No fracture or suspicious osseous lesion. Sinuses/Orbits: Visualized paranasal sinuses and mastoid air cells are clear. Right cataract extraction. Other: None. IMPRESSION: 1. Unchanged left subdural hematoma.  No midline shift. 2. No evidence of new intracranial abnormality. Electronically Signed   By: Logan Bores M.D.   On: 01/13/2018 10:32   Dg Hand Complete Left  Result Date: 01/13/2018 CLINICAL DATA:  Fall. EXAM: LEFT HAND - COMPLETE 3+ VIEW COMPARISON:  None. FINDINGS: Degenerate changes including at the base of the thumb, radiocarpal, and intercarpal articulations. No acute fracture or dislocation. IMPRESSION: No acute osseous abnormality. Electronically Signed   By: Abigail Miyamoto M.D.   On: 01/13/2018 10:49    ____________________________________________   PROCEDURES  Procedure(s) performed: None  Procedures  Critical Care performed:  No ____________________________________________   INITIAL IMPRESSION / ASSESSMENT AND PLAN / ED COURSE  Pertinent labs & imaging results that were available during my care of the patient were reviewed by me and considered in my medical decision making (see chart for details).  72 y.o. female with a history of end-stage dementia, recent acute on chronic subdural hematoma, presenting with a fall backwards striking her head.  The etiology of the patient's fall is unclear.  It is possible she lost her balance, other etiologies including syncope or seizure given her seizure history are also possible.  The patient will undergo laboratory testing, including UA, and has an EKG which does not show arrhythmia or ischemic changes.  From a trauma standpoint, the patient will undergo repeat CT of the head.  She does have some facial injury but no evidence of entrapment and given her husband's wishes, a dedicated CT of the face has not been ordered because she would not be an operative candidate.  X-ray of the patient's left hand will also be obtained as she is not had any imaging for this in our system.  Plan reevaluation for final disposition.  ----------------------------------------- 11:50 AM on 01/13/2018 -----------------------------------------  Patient's work-up in the emergency department has been reassuring.  Clinically, she continues to be at baseline.  Her repeat CT of the head does not show any change in her subdural.  Her laboratory studies are at baseline.  She does have 21-50 white blood cells and some small leukocyte esterase in her urine without bacteria; a urine culture has been sent and the patient has been given Keflex.  I will plan to discharge her home with a 5-day course of Keflex for UTI.  I had a long discussion with the patient's husband about her results as well as the plan of care, which she understands.  I did reiterate that this patient should not be on aspirin until she has been  cleared by her doctor to restart that medication; I wrote this on the discharge paperwork and reiterated it with her husband.  When she arrived, the [facility] from her facility still has aspirin listed in her medications.  At this time, the patient is safe for discharge home.  Follow-up instructions as well as return precautions were discussed.  ____________________________________________  FINAL CLINICAL IMPRESSION(S) / ED DIAGNOSES  Final diagnoses:  Fall, initial encounter  Acute cystitis without hematuria  Ecchymosis         NEW MEDICATIONS STARTED DURING THIS VISIT:  New Prescriptions   CEPHALEXIN (KEFLEX) 500 MG CAPSULE    Take 1 capsule (500 mg total) by mouth 4 (four) times daily for 5 days.  Eula Listen, MD 01/13/18 1152    Eula Listen, MD 01/13/18 [phone]

## 2018-01-13 NOTE — ED Notes (Signed)
Taken Joan Peters to the restroom with assistance with husband, Mr.Bohnenkamp. Patient was able to walk well with assistance when speaking with her.

## 2018-01-13 NOTE — ED Notes (Signed)
[facility]  CALLED  FOR  TRANSPORT 

## 2018-01-14 ENCOUNTER — Other Ambulatory Visit: Payer: Self-pay | Admitting: *Deleted

## 2018-01-14 NOTE — Patient Outreach (Signed)
Joan Peters) Care Management  01/14/2018  Joan Peters [name] [birth year] [id number]   Telephone Screen  Referral Date:  01/14/2018 Referral Source:  [facility] ED Census Reason for Referral:  6 or more ED visits in the past 6 months Insurance:  [employer]   Outreach Attempt:  Received referral for high ED utilization.  Per chart review of emergency room notes patient resident of [place].  RN Health Coach outreached to [place] and spoke with Joan Peters who confirms patient is resident there.  RN Health Coach outreached to patient's husband, Joan Peters.  HIPAA verified with husband and he confirms patient is resident of [place].  Reviewed and discussed [facility] services with husband.  He states patient is going to be receiving Hospice Care at the Facility in the near future and feels [facility] is caring for patient efficiently.  Declines services at this time.  Plan:  RN Health Coach will close case base on husband declining services and patient possibly under Hospice Care.   Joan Peters [phone] [email or web address]

## 2018-01-15 ENCOUNTER — Encounter: Payer: Self-pay | Admitting: Nurse Practitioner

## 2018-01-15 ENCOUNTER — Ambulatory Visit (INDEPENDENT_AMBULATORY_CARE_PROVIDER_SITE_OTHER): Payer: Medicare HMO | Admitting: Nurse Practitioner

## 2018-01-15 VITALS — BP 111/66 | HR 82 | Resp 16 | Ht 65.0 in | Wt 102.4 lb

## 2018-01-15 DIAGNOSIS — M810 Age-related osteoporosis without current pathological fracture: Secondary | ICD-10-CM

## 2018-01-15 DIAGNOSIS — F0281 Dementia in other diseases classified elsewhere with behavioral disturbance: Secondary | ICD-10-CM

## 2018-01-15 DIAGNOSIS — F329 Major depressive disorder, single episode, unspecified: Secondary | ICD-10-CM | POA: Diagnosis not present

## 2018-01-15 DIAGNOSIS — G309 Alzheimer's disease, unspecified: Secondary | ICD-10-CM

## 2018-01-15 DIAGNOSIS — R451 Restlessness and agitation: Secondary | ICD-10-CM

## 2018-01-15 MED ORDER — RISPERIDONE 1 MG PO TABS: 1.0000 mg | ORAL_TABLET | Freq: Three times a day (TID) | ORAL | 1 refills | Status: DC

## 2018-01-15 MED ORDER — RISPERIDONE 1 MG PO TABS: 1.0000 mg | ORAL_TABLET | Freq: Three times a day (TID) | ORAL | 3 refills | Status: AC

## 2018-01-15 NOTE — Progress Notes (Signed)
[facility] [place], [place] [address]  Internal MEDICINE  Office Visit Note  Patient Name: Joan Peters  [id number]  [id number]  Date of Service: 01/16/2018  Chief Complaint  Patient presents with  . Medical Management of Chronic Issues    rehab and medication follow up. pt has ad some falls and have some brain bleed, he is concerned about some medications.    The patient is now living at [facility]. She has been hospitalized at [facility] in [place], due to UTI and significant decline in mental status.  After discharge, medications changes were made. Evelina Bucy her off seroquel and suggested the addition of risperdone three times daily. Having this has helped her agitation much more. [facility] is unable to get this. The patient and her family need to have this prescription sent to [facility] and also to [facility].  Medication change has helped improve irritability and erratic behavior of the pationt. It does make her feel very sleepy.       Current Medication: Outpatient Encounter Medications as of 01/15/2018  Medication Sig Note  . acetaminophen (TYLENOL) 325 MG tablet Take 650 mg by mouth 3 (three) times daily.   Marland Kitchen alendronate (FOSAMAX) 70 MG tablet TAKE 1 TABLET ONE TIME WEEKLY 11/02/2017: Every Wednesday  . citalopram (CELEXA) 20 MG tablet Take 20 mg by mouth once daily. (Patient taking differently: Take 10 mg by mouth daily. )   . clonazePAM (KLONOPIN) 0.25 MG disintegrating tablet Take 0.25 mg by mouth 2 (two) times daily as needed (agitation).   . clonazePAM (KLONOPIN) 0.5 MG tablet Take 0.5 mg by mouth 2 (two) times daily as needed for anxiety.   . mirtazapine (REMERON) 15 MG tablet Take 15 mg by mouth at bedtime.   . risperiDONE (RISPERDAL) 1 MG tablet Take 1 tablet (1 mg total) by mouth 3 (three) times daily.   . sennosides (SENOKOT) 8.8 MG/5ML syrup Take 5 mLs by mouth daily.   . simvastatin (ZOCOR) 40 MG tablet Take 1 tablet (40 mg  total) by mouth at bedtime.   . traZODone (DESYREL) 50 MG tablet Take 1 tablet by mouth at bedtime as needed for sleep.   . [DISCONTINUED] risperiDONE (RISPERDAL) 1 MG tablet Take 1 tablet by mouth 3 (three) times daily.   . [DISCONTINUED] risperiDONE (RISPERDAL) 1 MG tablet Take 1 tablet (1 mg total) by mouth 3 (three) times daily.   . cephALEXin (KEFLEX) 500 MG capsule Take 1 capsule (500 mg total) by mouth 4 (four) times daily for 5 days.   . meloxicam (MOBIC) 7.5 MG tablet Take 7.5 mg by mouth daily.    . Phosphatidylserine-DHA-EPA 100-19.5-6.5 MG CAPS Take 1 capsule by mouth daily.   . [DISCONTINUED] divalproex (DEPAKOTE) 125 MG DR tablet Take 250 mg by mouth daily.    . [DISCONTINUED] QUEtiapine (SEROQUEL) 50 MG tablet Take 50 mg by mouth 3 (three) times daily. May take 1 tablet by mouth additional at bedtime as needed.    No facility-administered encounter medications on file as of 01/15/2018.     Surgical History: Past Surgical History:  Procedure Laterality Date  . ABDOMINAL HYSTERECTOMY    . CATARACT EXTRACTION    . CESAREAN SECTION    . right shoulder surgery      Medical History: Past Medical History:  Diagnosis Date  . Allergy   . Alzheimer disease ([place])   . Anxiety   . Dementia ([place])   . Depression   . Hyperlipidemia   .  Migraines   . Petit mal seizure status ([place])     Family History: Family History  Problem Relation Age of Onset  . Hypertension Neg Hx   . Hyperlipidemia Neg Hx   . Lung cancer Neg Hx   . Lymphoma Neg Hx   . Alcoholism Neg Hx     Social History   Socioeconomic History  . Marital status: Married    Spouse name: Not on file  . Number of children: Not on file  . Years of education: Not on file  . Highest education level: Not on file  Occupational History  . Not on file  Social Needs  . Financial resource strain: Not on file  . Food insecurity:    Worry: Not on file    Inability: Not on file  . Transportation needs:    Medical:  Not on file    Non-medical: Not on file  Tobacco Use  . Smoking status: Never Smoker  . Smokeless tobacco: Never Used  Substance and Sexual Activity  . Alcohol use: Not Currently  . Drug use: No  . Sexual activity: Not on file  Lifestyle  . Physical activity:    Days per week: Not on file    Minutes per session: Not on file  . Stress: Not on file  Relationships  . Social connections:    Talks on phone: Not on file    Gets together: Not on file    Attends religious service: Not on file    Active member of club or organization: Not on file    Attends meetings of clubs or organizations: Not on file    Relationship status: Not on file  . Intimate partner violence:    Fear of current or ex partner: Not on file    Emotionally abused: Not on file    Physically abused: Not on file    Forced sexual activity: Not on file  Other Topics Concern  . Not on file  Social History Narrative  . Not on file      Review of Systems  Constitutional: Positive for activity change and fatigue. Negative for chills and unexpected weight change.       Appetite improving.  HENT: Positive for rhinorrhea. Negative for congestion, postnasal drip, sneezing and sore throat.   Eyes: Negative.  Negative for redness.  Respiratory: Negative for cough, chest tightness, shortness of breath and wheezing.   Cardiovascular: Negative for chest pain and palpitations.  Gastrointestinal: Negative for abdominal distention, abdominal pain, constipation, diarrhea, nausea and vomiting.  Endocrine: Negative for cold intolerance, heat intolerance, polydipsia, polyphagia and polyuria.  Genitourinary: Negative.  Negative for dysuria and frequency.  Musculoskeletal: Negative for arthralgias, back pain, joint swelling and neck pain.  Skin: Negative for rash.  Allergic/Immunologic: Negative for environmental allergies.  Neurological: Positive for tremors and headaches. Negative for numbness.       Progressive memory loss with  agitation and severe behavior changes.   Hematological: Negative for adenopathy. Does not bruise/bleed easily.  Psychiatric/Behavioral: Positive for agitation, behavioral problems (Depression) and dysphoric mood. Negative for sleep disturbance and suicidal ideas. The patient is nervous/anxious.     Vital Signs: Today's Vitals   01/15/18 0932  BP: 111/66  Pulse: 82  Resp: 16  SpO2: 99%  Weight: 102 lb 6.4 oz (46.4 kg)  Height: 5\' 5"  (1.651 m)    Physical Exam  Constitutional: She appears well-developed and well-nourished. She appears listless. No distress.  HENT:  Head: Normocephalic and atraumatic.  Nose: Rhinorrhea present.  Mouth/Throat: Oropharynx is clear and moist. No oropharyngeal exudate.  Eyes: Pupils are equal, round, and reactive to light. Conjunctivae and EOM are normal.  Neck: Normal range of motion. Neck supple. No JVD present. Carotid bruit is not present. No tracheal deviation present. No thyromegaly present.  Cardiovascular: Normal rate, regular rhythm and normal heart sounds. Exam reveals no gallop and no friction rub.  No murmur heard. Pulmonary/Chest: Effort normal and breath sounds normal. No respiratory distress. She has no wheezes. She has no rales. She exhibits no tenderness.  Abdominal: Soft. Bowel sounds are normal. There is no tenderness.  Musculoskeletal: Normal range of motion.  Lymphadenopathy:    She has no cervical adenopathy.  Neurological: She appears listless. She displays tremor. No cranial nerve deficit.  Significant neurological decline since her last visit. She is sleeping a majority of the visit. While awake, she is not oriented to place, time, or situation. She is oriented to her husband who is present with her at today's visit.   Skin: Skin is warm and dry. She is not diaphoretic.  Psychiatric: Her mood appears anxious. Her affect is angry. She is agitated and withdrawn. Cognition and memory are impaired. She expresses inappropriate judgment.  She exhibits a depressed mood.  Patient is easily agitated during visit.   Nursing note and vitals reviewed.  Assessment/Plan: 1. Alzheimer's dementia with behavioral disturbance, unspecified timing of dementia onset [facility]) The patient was recently hospitalized at [facility] in [address], facility for elderly with mental status changes related to physical and mental health concerns. She now resides at [facility], which does have a locked unit for patient's with Alheimer's dementia. Reviewed medication changes and updated her chart to reflect these changes.   2. Osteoporosis of multiple sites Will d/c foxamax as it is difficult for her to swallow. Recommend she take OTC calcium with vitamin d supplement every day. Advised chewable tablet.   3. Major depression, chronic Citalopram dosing lowered from 20mg  to 10mg  daily. Will monitor.   4. Agitation D/c seroquel. Add in risperdal 1mg  up to three times daily. - risperiDONE (RISPERDAL) 1 MG tablet; Take 1 tablet (1 mg total) by mouth 3 (three) times daily.  Dispense: 270 tablet; Refill: 3  General Counseling: Krystiana verbalizes understanding of the findings of todays visit and agrees with plan of treatment. I have discussed any further diagnostic evaluation that may be needed or ordered today. We also reviewed her medications today. she has been encouraged to call the office with any questions or concerns that should arise related to todays visit.   This patient was seen by [facility] with Dr Lavera Guise as a part of collaborative care agreement   Meds ordered this encounter  Medications  . DISCONTD: risperiDONE (RISPERDAL) 1 MG tablet    Sig: Take 1 tablet (1 mg total) by mouth 3 (three) times daily.    Dispense:  90 tablet    Refill:  1    Order Specific Question:   Supervising Provider    Answer:   Lavera Guise [[id number]]  . risperiDONE (RISPERDAL) 1 MG tablet    Sig: Take 1 tablet (1 mg total) by mouth 3  (three) times daily.    Dispense:  270 tablet    Refill:  3    Order Specific Question:   Supervising Provider    Answer:   Lavera Guise [[id number]]    Time spent: 66 Minutes      Dr  Lavera Guise Internal medicine

## 2018-01-16 DIAGNOSIS — R451 Restlessness and agitation: Secondary | ICD-10-CM | POA: Insufficient documentation

## 2018-01-16 LAB — URINE CULTURE

## 2018-01-20 ENCOUNTER — Ambulatory Visit: Payer: Self-pay | Admitting: Internal Medicine

## 2018-01-21 ENCOUNTER — Emergency Department

## 2018-01-21 ENCOUNTER — Encounter: Payer: Self-pay | Admitting: Emergency Medicine

## 2018-01-21 ENCOUNTER — Emergency Department
Admission: EM | Admit: 2018-01-21 | Discharge: 2018-01-21 | Disposition: A | Discharge status: Home / Self Care | Attending: Emergency Medicine | Admitting: Emergency Medicine

## 2018-01-21 DIAGNOSIS — R569 Unspecified convulsions: Secondary | ICD-10-CM | POA: Insufficient documentation

## 2018-01-21 DIAGNOSIS — F0391 Unspecified dementia with behavioral disturbance: Secondary | ICD-10-CM | POA: Diagnosis not present

## 2018-01-21 DIAGNOSIS — J40 Bronchitis, not specified as acute or chronic: Secondary | ICD-10-CM | POA: Diagnosis not present

## 2018-01-21 DIAGNOSIS — Z79899 Other long term (current) drug therapy: Secondary | ICD-10-CM | POA: Diagnosis not present

## 2018-01-21 DIAGNOSIS — S065X9A Traumatic subdural hemorrhage with loss of consciousness of unspecified duration, initial encounter: Secondary | ICD-10-CM | POA: Insufficient documentation

## 2018-01-21 DIAGNOSIS — Z7401 Bed confinement status: Secondary | ICD-10-CM | POA: Diagnosis not present

## 2018-01-21 DIAGNOSIS — R000 Tachycardia, unspecified: Secondary | ICD-10-CM | POA: Diagnosis not present

## 2018-01-21 DIAGNOSIS — Y998 Other external cause status: Secondary | ICD-10-CM | POA: Diagnosis not present

## 2018-01-21 DIAGNOSIS — X58XXXA Exposure to other specified factors, initial encounter: Secondary | ICD-10-CM | POA: Insufficient documentation

## 2018-01-21 DIAGNOSIS — S0990XA Unspecified injury of head, initial encounter: Secondary | ICD-10-CM | POA: Diagnosis present

## 2018-01-21 DIAGNOSIS — F0390 Unspecified dementia without behavioral disturbance: Secondary | ICD-10-CM | POA: Insufficient documentation

## 2018-01-21 DIAGNOSIS — S065XAA Traumatic subdural hemorrhage with loss of consciousness status unknown, initial encounter: Secondary | ICD-10-CM

## 2018-01-21 DIAGNOSIS — R4182 Altered mental status, unspecified: Secondary | ICD-10-CM | POA: Diagnosis not present

## 2018-01-21 DIAGNOSIS — Y939 Activity, unspecified: Secondary | ICD-10-CM | POA: Insufficient documentation

## 2018-01-21 DIAGNOSIS — Y92129 Unspecified place in nursing home as the place of occurrence of the external cause: Secondary | ICD-10-CM | POA: Diagnosis not present

## 2018-01-21 DIAGNOSIS — S065X0A Traumatic subdural hemorrhage without loss of consciousness, initial encounter: Secondary | ICD-10-CM | POA: Diagnosis not present

## 2018-01-21 DIAGNOSIS — F05 Delirium due to known physiological condition: Secondary | ICD-10-CM | POA: Diagnosis not present

## 2018-01-21 LAB — CBC WITH DIFFERENTIAL/PLATELET
ABS IMMATURE GRANULOCYTES: 0.05 10*3/uL (ref 0.00–0.07)
BASOS ABS: 0 10*3/uL (ref 0.0–0.1)
BASOS PCT: 0 %
EOS PCT: 1 %
Eosinophils Absolute: 0.1 10*3/uL (ref 0.0–0.5)
HCT: 35.3 % — ABNORMAL LOW (ref 36.0–46.0)
HEMOGLOBIN: 10.9 g/dL — AB (ref 12.0–15.0)
Immature Granulocytes: 1 %
Lymphocytes Relative: 31 %
Lymphs Abs: 1.8 10*3/uL (ref 0.7–4.0)
MCH: 28.8 pg (ref 26.0–34.0)
MCHC: 30.9 g/dL (ref 30.0–36.0)
MCV: 93.4 fL (ref 80.0–100.0)
Monocytes Absolute: 0.8 10*3/uL (ref 0.1–1.0)
Monocytes Relative: 14 %
NEUTROS PCT: 53 %
NRBC: 0 % (ref 0.0–0.2)
Neutro Abs: 3 10*3/uL (ref 1.7–7.7)
PLATELETS: 321 10*3/uL (ref 150–400)
RBC: 3.78 MIL/uL — AB (ref 3.87–5.11)
RDW: 15.7 % — ABNORMAL HIGH (ref 11.5–15.5)
WBC: 5.7 10*3/uL (ref 4.0–10.5)

## 2018-01-21 LAB — COMPREHENSIVE METABOLIC PANEL
ALBUMIN: 3.5 g/dL (ref 3.5–5.0)
ALT: 12 U/L (ref 0–44)
ANION GAP: 10 (ref 5–15)
AST: 19 U/L (ref 15–41)
Alkaline Phosphatase: 65 U/L (ref 38–126)
BUN: 13 mg/dL (ref 8–23)
CALCIUM: 8.4 mg/dL — AB (ref 8.9–10.3)
CHLORIDE: 102 mmol/L (ref 98–111)
CO2: 27 mmol/L (ref 22–32)
Creatinine, Ser: 0.53 mg/dL (ref 0.44–1.00)
GFR calc Af Amer: >60 mL/min (ref >60)
GFR calc non Af Amer: >60 mL/min (ref >60)
GLUCOSE: 160 mg/dL — AB (ref 70–99)
Potassium: 3.8 mmol/L (ref 3.5–5.1)
SODIUM: 139 mmol/L (ref 135–145)
Total Bilirubin: 0.5 mg/dL (ref 0.3–1.2)
Total Protein: 6.3 g/dL — ABNORMAL LOW (ref 6.5–8.1)

## 2018-01-21 LAB — TROPONIN I: Troponin I: <0.03 ng/mL (ref <0.03)

## 2018-01-21 LAB — VALPROIC ACID LEVEL

## 2018-01-21 MED ORDER — SODIUM CHLORIDE 0.9 % IV BOLUS: 1000.0000 mL | Freq: Once | INTRAVENOUS | Status: DC

## 2018-01-21 MED ORDER — LORAZEPAM 2 MG/ML IJ SOLN
1.0000 mg | Freq: Once | INTRAMUSCULAR | Status: DC
  Filled 2018-01-21: qty 1

## 2018-01-21 MED ORDER — DIVALPROEX SODIUM 125 MG PO CSDR: 250.0000 mg | DELAYED_RELEASE_CAPSULE | Freq: Two times a day (BID) | ORAL | 0 refills | Status: AC

## 2018-01-21 MED ORDER — DIVALPROEX SODIUM 250 MG PO DR TAB: 250.0000 mg | DELAYED_RELEASE_TABLET | Freq: Two times a day (BID) | ORAL | 0 refills | Status: DC

## 2018-01-21 MED ORDER — HALOPERIDOL LACTATE 5 MG/ML IJ SOLN
2.0000 mg | Freq: Once | INTRAMUSCULAR | Status: AC
  Administered 2018-01-21: 2 mg via INTRAVENOUS
  Filled 2018-01-21: qty 1

## 2018-01-21 MED ORDER — VALPROATE SODIUM 500 MG/5ML IV SOLN
500.0000 mg | Freq: Once | INTRAVENOUS | Status: AC
  Administered 2018-01-21: 500 mg via INTRAVENOUS
  Filled 2018-01-21: qty 5

## 2018-01-21 NOTE — ED Triage Notes (Addendum)
Pt arrived via ems from [facility] with concerns over seizure like activity. Pt stopped Depakote 2 weeks prior and was switched to new medication. Pt arrives alert but disoriented, pt has dementia at baseline. Old bruising noted to pt's face. PT very uncooperative to triage process

## 2018-01-21 NOTE — ED Notes (Signed)
[facility]  CALLED  FOR  TRANSPORT  BACK TO  [place]  HOUSE 

## 2018-01-21 NOTE — ED Notes (Signed)
[facility] at bedside for transport.

## 2018-01-21 NOTE — Discharge Instructions (Signed)
Take depakote 250 mg twice daily.   See neurology for follow up   Continue your other meds  Return to ER if she has another seizure, lethargy, agitation, fever.

## 2018-01-21 NOTE — ED Provider Notes (Signed)
[facility] EMERGENCY DEPARTMENT Provider Note   CSN: [id number] Arrival date & time: 01/21/18  6967     History   Chief Complaint Chief Complaint  Patient presents with  . Seizure like activty    HPI Joan Peters is a 72 y.o. female hx of dementia, depression, chronic subdural hemorrhage, here presenting with seizure-like activity, agitation.  Patient is from [place] and recently taken off of Depakote about 2 weeks ago and put on Risperdal.  Patient was recently seen in the ED and had a CT head that showed a chronic subdural and finished abx for UTI.  Patient apparently had a witnessed seizure-like activity in the nursing home.  By the time EMS got there, patient is back to baseline.  She is agitated and unable to give much history.  EMS and per the husband, this is baseline for her.  The history is provided by the patient, the nursing home and the spouse. The history is limited by the condition of the patient.  Level V caveat- dementia   Past Medical History:  Diagnosis Date  . Allergy   . Alzheimer disease ([place])   . Anxiety   . Dementia ([place])   . Depression   . Hyperlipidemia   . Migraines   . Petit mal seizure status [facility])     Patient Active Problem List   Diagnosis Date Noted  . Agitation 01/16/2018  . Major depression, chronic 09/19/2017  . Mixed hyperlipidemia 06/15/2017  . Anxiety 06/15/2017  . Alzheimer disease ([place]) 06/15/2017  . Osteoporosis of multiple sites 06/15/2017    Past Surgical History:  Procedure Laterality Date  . ABDOMINAL HYSTERECTOMY    . CATARACT EXTRACTION    . CESAREAN SECTION    . right shoulder surgery       OB History   None      Home Medications    Prior to Admission medications   Medication Sig Start Date End Date Taking? Authorizing Provider  acetaminophen (TYLENOL) 325 MG tablet Take 650 mg by mouth 3 (three) times daily.    [provider]  citalopram (CELEXA) 20 MG tablet  Take 20 mg by mouth once daily. Patient taking differently: Take 10 mg by mouth daily.  12/22/17   Ronnell Freshwater, NP  clonazePAM (KLONOPIN) 0.25 MG disintegrating tablet Take 0.25 mg by mouth 2 (two) times daily as needed (agitation).    [provider]  clonazePAM (KLONOPIN) 0.5 MG tablet Take 0.5 mg by mouth 2 (two) times daily as needed for anxiety.    [provider]  meloxicam (MOBIC) 7.5 MG tablet Take 7.5 mg by mouth daily.  11/27/16   [provider]  mirtazapine (REMERON) 15 MG tablet Take 15 mg by mouth at bedtime.    [provider]  Phosphatidylserine-DHA-EPA 100-19.5-6.5 MG CAPS Take 1 capsule by mouth daily.    [provider]  risperiDONE (RISPERDAL) 1 MG tablet Take 1 tablet (1 mg total) by mouth 3 (three) times daily. 01/15/18   Ronnell Freshwater, NP  sennosides (SENOKOT) 8.8 MG/5ML syrup Take 5 mLs by mouth daily. 01/07/18 02/06/18  [provider]  simvastatin (ZOCOR) 40 MG tablet Take 1 tablet (40 mg total) by mouth at bedtime. 12/22/17   Ronnell Freshwater, NP  traZODone (DESYREL) 50 MG tablet Take 1 tablet by mouth at bedtime as needed for sleep. 01/06/18   [provider]    Family History Family History  Problem Relation Age of Onset  .  Hypertension Neg Hx   . Hyperlipidemia Neg Hx   . Lung cancer Neg Hx   . Lymphoma Neg Hx   . Alcoholism Neg Hx     Social History Social History   Tobacco Use  . Smoking status: Never Smoker  . Smokeless tobacco: Never Used  Substance Use Topics  . Alcohol use: Not Currently  . Drug use: No     Allergies   Sulfa antibiotics   Review of Systems Review of Systems  Neurological: Positive for seizures.  Psychiatric/Behavioral: Positive for agitation.  All other systems reviewed and are negative.    Physical Exam Updated Vital Signs BP 135/76 (BP Location: Left Arm)   Pulse 100   Temp 97.7 F (36.5 C) (Axillary)   Resp 18   Ht 5\' 5"  (1.651 m)   Wt 46.4  kg   SpO2 100%   BMI 17.04 kg/m   Physical Exam  Constitutional:  Agitated, no obvious seizure like activity, no eye deviation   HENT:  Head: Normocephalic and atraumatic.  No obvious scalp hematoma   Eyes: Pupils are equal, round, and reactive to light. Conjunctivae and EOM are normal.  Neck: Normal range of motion. Neck supple.  Cardiovascular: Normal rate, regular rhythm and normal heart sounds.  Pulmonary/Chest: Effort normal and breath sounds normal. No stridor. No respiratory distress.  Abdominal: Soft. Bowel sounds are normal. She exhibits no distension. There is no tenderness.  Musculoskeletal: Normal range of motion.  Neurological: She is alert.  Awake, alert, agitated, nonfocal neuro exam. No obvious seizure like activity.   Skin: Skin is warm.  Psychiatric:  Unable   Nursing note and vitals reviewed.    ED Treatments / Results  Labs (all labs ordered are listed, but only abnormal results are displayed) Labs Reviewed  CBC WITH DIFFERENTIAL/PLATELET - Abnormal; Notable for the following components:      Result Value   RBC 3.78 (*)    Hemoglobin 10.9 (*)    HCT 35.3 (*)    RDW 15.7 (*)    All other components within normal limits  COMPREHENSIVE METABOLIC PANEL - Abnormal; Notable for the following components:   Glucose, Bld 160 (*)    Calcium 8.4 (*)    Total Protein 6.3 (*)    All other components within normal limits  TROPONIN I  VALPROIC ACID LEVEL    EKG EKG Interpretation  Date/Time:  Wednesday January 21 2018 09:12:51 EDT Ventricular Rate:  101 PR Interval:    QRS Duration: 90 QT Interval:  342 QTC Calculation: 444 R Axis:   50 Text Interpretation:  Sinus tachycardia Biatrial enlargement No significant change since last tracing Confirmed by Wandra Arthurs [phone]) on 01/21/2018 9:20:44 AM   Radiology Ct Head Wo Contrast  Result Date: 01/21/2018 CLINICAL DATA:  Altered mental status with apparent seizure. Recent subdural hematoma EXAM: CT HEAD  WITHOUT CONTRAST TECHNIQUE: Contiguous axial images were obtained from the base of the skull through the vertex without intravenous contrast. COMPARISON:  January 13, 2018; November 02, 2017; Aug 06, 2017 FINDINGS: Brain: There is underlying moderate diffuse atrophy, stable. There is a persistent left-sided subdural hematoma which has both chronic appearing fluid as well as more recent appearing hemorrhage, stable. No new hemorrhage is seen compared to 8 days prior. The maximum thickness of this mixed attenuation subdural hygroma/hematoma in the left parietal region is 9 mm, stable and better appreciated on coronal images. In the left frontal region, the maximum thickness of this extra-axial fluid collection  is 6 mm, stable. There is modest mass effect on the left frontal, parietal, and superior temporal lobes without midline shift. The degree of mass effect is stable. There is no new extra-axial fluid. There is no intra-axial hemorrhage. No intra-axial mass. There is mild small vessel disease in the centra semiovale bilaterally. No acute infarct evident. Vascular: No evident hyperdense vessel. There is mild calcification in each carotid siphon region. Skull: The bony calvarium appears intact. Sinuses/Orbits: There is opacification in multiple ethmoid air cells, particularly anteriorly, as well as in both frontal sinuses inferiorly. There is mild mucosal thickening in the right sphenoid sinus. Visualized orbits appear symmetric bilaterally. Other: Mastoid air cells are clear. IMPRESSION: Stable left-sided subdural hematoma with mixed attenuation consistent with chronic fluid as well as more recent hemorrhage, stable. The degree of thickness of the left-sided subdural hematoma and mass effect are stable. No new mass effect or hemorrhage. No midline shift. No intra-axial hemorrhage. No mass or acute infarct evident. Underlying atrophy with periventricular small vessel disease is stable. There are foci of arterial vascular  calcification. There is extensive multifocal paranasal sinus disease. Electronically Signed   By: Lowella Grip III M.D.   On: 01/21/2018 09:57    Procedures Procedures (including critical care time)  Medications Ordered in ED Medications  LORazepam (ATIVAN) injection 1 mg (1 mg Intravenous Not Given 01/21/18 0928)  sodium chloride 0.9 % bolus 1,000 mL (has no administration in time range)  haloperidol lactate (HALDOL) injection 2 mg (2 mg Intravenous Given 01/21/18 0906)     Initial Impression / Assessment and Plan / ED Course  I have reviewed the triage vital signs and the nursing notes.  Pertinent labs & imaging results that were available during my care of the patient were reviewed by me and considered in my medical decision making (see chart for details).    Charley Raenah Murley is a 72 y.o. female here with possible seizure. She has hx of dementia and has chronic subdural so is at risk for seizures. No obvious seizure like activity currently. Will repeat CT head, labs. Will give haldol for agitation.   10:18 AM CT head showed chronic subdural, unchanged from 8 days ago. Labs unremarkable. I called Dr. Doy Mince from neurology. She recommend IV depakote 500 mg and then dc back to nursing home with 250 mg BID.   12:11 PM Labs unremarkable. Loaded with IV depakote and mental status remained at baseline. Will dc back with depakote 250 mg BID, neuro follow up.    Final Clinical Impressions(s) / ED Diagnoses   Final diagnoses:  None    ED Discharge Orders    None       Drenda Freeze, MD 01/21/18 1212

## 2018-01-21 NOTE — ED Notes (Signed)
Pt had bowel movement in brief, peri care provided by this RN with the assistance of RN Denice Paradise. New brief applied.

## 2018-01-27 ENCOUNTER — Ambulatory Visit: Payer: Self-pay | Admitting: Nurse Practitioner

## 2018-02-16 ENCOUNTER — Telehealth: Payer: Self-pay

## 2018-02-16 DIAGNOSIS — G300 Alzheimer's disease with early onset: Secondary | ICD-10-CM | POA: Diagnosis not present

## 2018-02-16 NOTE — Telephone Encounter (Signed)
Patient paperwork for patient husband up front and ready for pick up regarding health and finances.Joan Peters

## 2018-02-17 DIAGNOSIS — G300 Alzheimer's disease with early onset: Secondary | ICD-10-CM | POA: Diagnosis not present

## 2018-02-18 DIAGNOSIS — G300 Alzheimer's disease with early onset: Secondary | ICD-10-CM | POA: Diagnosis not present

## 2018-02-19 DIAGNOSIS — G300 Alzheimer's disease with early onset: Secondary | ICD-10-CM | POA: Diagnosis not present

## 2018-02-20 DIAGNOSIS — G300 Alzheimer's disease with early onset: Secondary | ICD-10-CM | POA: Diagnosis not present

## 2018-02-21 DIAGNOSIS — G300 Alzheimer's disease with early onset: Secondary | ICD-10-CM | POA: Diagnosis not present

## 2018-02-22 DIAGNOSIS — G300 Alzheimer's disease with early onset: Secondary | ICD-10-CM | POA: Diagnosis not present

## 2018-02-23 DIAGNOSIS — G300 Alzheimer's disease with early onset: Secondary | ICD-10-CM | POA: Diagnosis not present

## 2018-02-24 DIAGNOSIS — G300 Alzheimer's disease with early onset: Secondary | ICD-10-CM | POA: Diagnosis not present

## 2018-02-25 DIAGNOSIS — G300 Alzheimer's disease with early onset: Secondary | ICD-10-CM | POA: Diagnosis not present

## 2018-02-26 DIAGNOSIS — G300 Alzheimer's disease with early onset: Secondary | ICD-10-CM | POA: Diagnosis not present

## 2018-02-27 DIAGNOSIS — G300 Alzheimer's disease with early onset: Secondary | ICD-10-CM | POA: Diagnosis not present

## 2018-02-28 DIAGNOSIS — G300 Alzheimer's disease with early onset: Secondary | ICD-10-CM | POA: Diagnosis not present

## 2018-03-01 DIAGNOSIS — G300 Alzheimer's disease with early onset: Secondary | ICD-10-CM | POA: Diagnosis not present

## 2018-03-06 ENCOUNTER — Telehealth: Payer: Self-pay

## 2018-03-06 ENCOUNTER — Other Ambulatory Visit: Payer: Self-pay

## 2018-03-06 MED ORDER — CITALOPRAM HYDROBROMIDE 10 MG PO TABS: 10.0000 mg | ORAL_TABLET | Freq: Every day | ORAL | 5 refills | Status: AC

## 2018-03-06 NOTE — Telephone Encounter (Signed)
Faxed celexa pres to [facility]

## 2018-03-06 NOTE — Telephone Encounter (Signed)
Tis is fine. Do you mind faxing this for me? Thanks

## 2018-03-06 NOTE — Telephone Encounter (Signed)
Faxed pres to [facility]

## 2018-03-09 DIAGNOSIS — G300 Alzheimer's disease with early onset: Secondary | ICD-10-CM | POA: Diagnosis not present

## 2018-03-10 DIAGNOSIS — G300 Alzheimer's disease with early onset: Secondary | ICD-10-CM | POA: Diagnosis not present

## 2018-03-11 DIAGNOSIS — G300 Alzheimer's disease with early onset: Secondary | ICD-10-CM | POA: Diagnosis not present

## 2018-03-12 DIAGNOSIS — G300 Alzheimer's disease with early onset: Secondary | ICD-10-CM | POA: Diagnosis not present

## 2018-03-13 DIAGNOSIS — G300 Alzheimer's disease with early onset: Secondary | ICD-10-CM | POA: Diagnosis not present

## 2018-03-14 DIAGNOSIS — G300 Alzheimer's disease with early onset: Secondary | ICD-10-CM | POA: Diagnosis not present

## 2018-03-15 DIAGNOSIS — G300 Alzheimer's disease with early onset: Secondary | ICD-10-CM | POA: Diagnosis not present

## 2018-03-16 DIAGNOSIS — G300 Alzheimer's disease with early onset: Secondary | ICD-10-CM | POA: Diagnosis not present

## 2018-03-17 DIAGNOSIS — G300 Alzheimer's disease with early onset: Secondary | ICD-10-CM | POA: Diagnosis not present

## 2018-03-18 DIAGNOSIS — G300 Alzheimer's disease with early onset: Secondary | ICD-10-CM | POA: Diagnosis not present

## 2018-03-19 DIAGNOSIS — G300 Alzheimer's disease with early onset: Secondary | ICD-10-CM | POA: Diagnosis not present

## 2018-03-20 DIAGNOSIS — G300 Alzheimer's disease with early onset: Secondary | ICD-10-CM | POA: Diagnosis not present

## 2018-03-21 DIAGNOSIS — G300 Alzheimer's disease with early onset: Secondary | ICD-10-CM | POA: Diagnosis not present

## 2018-03-22 DIAGNOSIS — G300 Alzheimer's disease with early onset: Secondary | ICD-10-CM | POA: Diagnosis not present

## 2018-03-23 DIAGNOSIS — G300 Alzheimer's disease with early onset: Secondary | ICD-10-CM | POA: Diagnosis not present

## 2018-03-24 DIAGNOSIS — G300 Alzheimer's disease with early onset: Secondary | ICD-10-CM | POA: Diagnosis not present

## 2018-03-25 DIAGNOSIS — G300 Alzheimer's disease with early onset: Secondary | ICD-10-CM | POA: Diagnosis not present

## 2018-03-26 DIAGNOSIS — G300 Alzheimer's disease with early onset: Secondary | ICD-10-CM | POA: Diagnosis not present

## 2018-03-27 DIAGNOSIS — G300 Alzheimer's disease with early onset: Secondary | ICD-10-CM | POA: Diagnosis not present

## 2018-03-28 DIAGNOSIS — G300 Alzheimer's disease with early onset: Secondary | ICD-10-CM | POA: Diagnosis not present

## 2018-03-29 DIAGNOSIS — G300 Alzheimer's disease with early onset: Secondary | ICD-10-CM | POA: Diagnosis not present

## 2018-03-30 DIAGNOSIS — G300 Alzheimer's disease with early onset: Secondary | ICD-10-CM | POA: Diagnosis not present

## 2018-03-31 DIAGNOSIS — G300 Alzheimer's disease with early onset: Secondary | ICD-10-CM | POA: Diagnosis not present

## 2018-04-01 DIAGNOSIS — G300 Alzheimer's disease with early onset: Secondary | ICD-10-CM | POA: Diagnosis not present

## 2018-04-02 DIAGNOSIS — G300 Alzheimer's disease with early onset: Secondary | ICD-10-CM | POA: Diagnosis not present

## 2018-04-03 DIAGNOSIS — G300 Alzheimer's disease with early onset: Secondary | ICD-10-CM | POA: Diagnosis not present

## 2018-04-04 DIAGNOSIS — G300 Alzheimer's disease with early onset: Secondary | ICD-10-CM | POA: Diagnosis not present

## 2018-04-05 DIAGNOSIS — G300 Alzheimer's disease with early onset: Secondary | ICD-10-CM | POA: Diagnosis not present

## 2018-04-06 DIAGNOSIS — G300 Alzheimer's disease with early onset: Secondary | ICD-10-CM | POA: Diagnosis not present

## 2018-04-07 DIAGNOSIS — G300 Alzheimer's disease with early onset: Secondary | ICD-10-CM | POA: Diagnosis not present

## 2018-04-08 DIAGNOSIS — G300 Alzheimer's disease with early onset: Secondary | ICD-10-CM | POA: Diagnosis not present

## 2018-04-09 DIAGNOSIS — G300 Alzheimer's disease with early onset: Secondary | ICD-10-CM | POA: Diagnosis not present

## 2018-04-10 DIAGNOSIS — G300 Alzheimer's disease with early onset: Secondary | ICD-10-CM | POA: Diagnosis not present

## 2018-04-11 DIAGNOSIS — G300 Alzheimer's disease with early onset: Secondary | ICD-10-CM | POA: Diagnosis not present

## 2018-04-12 DIAGNOSIS — G300 Alzheimer's disease with early onset: Secondary | ICD-10-CM | POA: Diagnosis not present

## 2018-04-13 DIAGNOSIS — G300 Alzheimer's disease with early onset: Secondary | ICD-10-CM | POA: Diagnosis not present

## 2018-04-14 DIAGNOSIS — G300 Alzheimer's disease with early onset: Secondary | ICD-10-CM | POA: Diagnosis not present

## 2018-04-15 DIAGNOSIS — G300 Alzheimer's disease with early onset: Secondary | ICD-10-CM | POA: Diagnosis not present

## 2018-04-16 DIAGNOSIS — G300 Alzheimer's disease with early onset: Secondary | ICD-10-CM | POA: Diagnosis not present

## 2018-04-17 DIAGNOSIS — G300 Alzheimer's disease with early onset: Secondary | ICD-10-CM | POA: Diagnosis not present

## 2018-04-18 DIAGNOSIS — G300 Alzheimer's disease with early onset: Secondary | ICD-10-CM | POA: Diagnosis not present

## 2018-04-19 DIAGNOSIS — G300 Alzheimer's disease with early onset: Secondary | ICD-10-CM | POA: Diagnosis not present

## 2018-04-27 DIAGNOSIS — G300 Alzheimer's disease with early onset: Secondary | ICD-10-CM | POA: Diagnosis not present

## 2018-04-28 DIAGNOSIS — G300 Alzheimer's disease with early onset: Secondary | ICD-10-CM | POA: Diagnosis not present

## 2018-04-29 DIAGNOSIS — G300 Alzheimer's disease with early onset: Secondary | ICD-10-CM | POA: Diagnosis not present

## 2018-04-30 DIAGNOSIS — G300 Alzheimer's disease with early onset: Secondary | ICD-10-CM | POA: Diagnosis not present

## 2018-05-01 DIAGNOSIS — G300 Alzheimer's disease with early onset: Secondary | ICD-10-CM | POA: Diagnosis not present

## 2018-05-02 DIAGNOSIS — G300 Alzheimer's disease with early onset: Secondary | ICD-10-CM | POA: Diagnosis not present

## 2018-05-03 DIAGNOSIS — G300 Alzheimer's disease with early onset: Secondary | ICD-10-CM | POA: Diagnosis not present

## 2018-05-04 DIAGNOSIS — G300 Alzheimer's disease with early onset: Secondary | ICD-10-CM | POA: Diagnosis not present

## 2018-05-05 DIAGNOSIS — G300 Alzheimer's disease with early onset: Secondary | ICD-10-CM | POA: Diagnosis not present

## 2018-05-06 DIAGNOSIS — G300 Alzheimer's disease with early onset: Secondary | ICD-10-CM | POA: Diagnosis not present

## 2018-05-07 DIAGNOSIS — R569 Unspecified convulsions: Secondary | ICD-10-CM | POA: Diagnosis not present

## 2018-05-07 DIAGNOSIS — Z79899 Other long term (current) drug therapy: Secondary | ICD-10-CM | POA: Diagnosis not present

## 2018-05-07 DIAGNOSIS — Z8659 Personal history of other mental and behavioral disorders: Secondary | ICD-10-CM | POA: Diagnosis not present

## 2018-05-07 DIAGNOSIS — F0280 Dementia in other diseases classified elsewhere without behavioral disturbance: Secondary | ICD-10-CM | POA: Diagnosis not present

## 2018-05-07 DIAGNOSIS — E538 Deficiency of other specified B group vitamins: Secondary | ICD-10-CM | POA: Diagnosis not present

## 2018-05-07 DIAGNOSIS — G301 Alzheimer's disease with late onset: Secondary | ICD-10-CM | POA: Diagnosis not present

## 2018-05-07 DIAGNOSIS — E559 Vitamin D deficiency, unspecified: Secondary | ICD-10-CM | POA: Diagnosis not present

## 2018-05-07 DIAGNOSIS — G300 Alzheimer's disease with early onset: Secondary | ICD-10-CM | POA: Diagnosis not present

## 2018-05-07 DIAGNOSIS — G43109 Migraine with aura, not intractable, without status migrainosus: Secondary | ICD-10-CM | POA: Diagnosis not present

## 2018-05-08 DIAGNOSIS — G300 Alzheimer's disease with early onset: Secondary | ICD-10-CM | POA: Diagnosis not present

## 2018-05-09 DIAGNOSIS — G300 Alzheimer's disease with early onset: Secondary | ICD-10-CM | POA: Diagnosis not present

## 2018-05-10 DIAGNOSIS — G300 Alzheimer's disease with early onset: Secondary | ICD-10-CM | POA: Diagnosis not present

## 2018-05-11 DIAGNOSIS — G300 Alzheimer's disease with early onset: Secondary | ICD-10-CM | POA: Diagnosis not present

## 2018-05-12 DIAGNOSIS — G300 Alzheimer's disease with early onset: Secondary | ICD-10-CM | POA: Diagnosis not present

## 2018-05-13 DIAGNOSIS — G300 Alzheimer's disease with early onset: Secondary | ICD-10-CM | POA: Diagnosis not present

## 2018-05-14 DIAGNOSIS — G300 Alzheimer's disease with early onset: Secondary | ICD-10-CM | POA: Diagnosis not present

## 2018-05-15 DIAGNOSIS — G300 Alzheimer's disease with early onset: Secondary | ICD-10-CM | POA: Diagnosis not present

## 2018-05-16 DIAGNOSIS — G300 Alzheimer's disease with early onset: Secondary | ICD-10-CM | POA: Diagnosis not present

## 2018-05-17 DIAGNOSIS — G300 Alzheimer's disease with early onset: Secondary | ICD-10-CM | POA: Diagnosis not present

## 2018-05-18 DIAGNOSIS — G300 Alzheimer's disease with early onset: Secondary | ICD-10-CM | POA: Diagnosis not present

## 2018-05-19 DIAGNOSIS — G300 Alzheimer's disease with early onset: Secondary | ICD-10-CM | POA: Diagnosis not present

## 2018-05-20 ENCOUNTER — Telehealth: Payer: Self-pay

## 2018-05-20 DIAGNOSIS — G300 Alzheimer's disease with early onset: Secondary | ICD-10-CM | POA: Diagnosis not present

## 2018-05-20 NOTE — Telephone Encounter (Signed)
CALLIE FOSTER, NURSE FROM HOSPICE, CALLED FOR PT BECAUSE HER RIGHT KNEE IS SWOLLEN AND WANTED TO SEE IF PROVIDER WOULD LIKE FOR PT TO GET XRAY. SPOKE WITH HEATHER AND SHE GAVE THE OKAY.  CALLED CALLIE BACK AND TINA GAVE A VERBAL ORDER FOR X-RAY OF RIGHT KNEE WITH 2 VIEWS.

## 2018-05-21 DIAGNOSIS — G300 Alzheimer's disease with early onset: Secondary | ICD-10-CM | POA: Diagnosis not present

## 2018-05-22 ENCOUNTER — Encounter: Payer: Self-pay | Admitting: Nurse Practitioner

## 2018-05-22 DIAGNOSIS — G300 Alzheimer's disease with early onset: Secondary | ICD-10-CM | POA: Diagnosis not present

## 2018-05-23 DIAGNOSIS — G300 Alzheimer's disease with early onset: Secondary | ICD-10-CM | POA: Diagnosis not present

## 2018-05-24 DIAGNOSIS — G300 Alzheimer's disease with early onset: Secondary | ICD-10-CM | POA: Diagnosis not present

## 2018-05-25 DIAGNOSIS — G300 Alzheimer's disease with early onset: Secondary | ICD-10-CM | POA: Diagnosis not present

## 2018-05-26 DIAGNOSIS — G300 Alzheimer's disease with early onset: Secondary | ICD-10-CM | POA: Diagnosis not present

## 2018-05-26 DIAGNOSIS — H26493 Other secondary cataract, bilateral: Secondary | ICD-10-CM | POA: Diagnosis not present

## 2018-05-27 DIAGNOSIS — G300 Alzheimer's disease with early onset: Secondary | ICD-10-CM | POA: Diagnosis not present

## 2018-05-28 DIAGNOSIS — G300 Alzheimer's disease with early onset: Secondary | ICD-10-CM | POA: Diagnosis not present

## 2018-05-29 DIAGNOSIS — G300 Alzheimer's disease with early onset: Secondary | ICD-10-CM | POA: Diagnosis not present

## 2018-05-30 DIAGNOSIS — G300 Alzheimer's disease with early onset: Secondary | ICD-10-CM | POA: Diagnosis not present

## 2018-05-31 DIAGNOSIS — G300 Alzheimer's disease with early onset: Secondary | ICD-10-CM | POA: Diagnosis not present

## 2018-06-01 DIAGNOSIS — G300 Alzheimer's disease with early onset: Secondary | ICD-10-CM | POA: Diagnosis not present

## 2018-06-02 ENCOUNTER — Telehealth: Payer: Self-pay

## 2018-06-02 DIAGNOSIS — G300 Alzheimer's disease with early onset: Secondary | ICD-10-CM | POA: Diagnosis not present

## 2018-06-02 NOTE — Telephone Encounter (Signed)
Spoke with patient's husband the swelling has went down and she taking otc tylenolol for arthritis and due to transportation its very difficult to get patient out of facility and will keep an eye on it and if it worsens they will try and schedule orthopedic referral. Joan Peters

## 2018-06-02 NOTE — Telephone Encounter (Signed)
Spoke with jennifer from [facility] about knee xray showed slight joint effusion if still having we refer her ortho as per jennifer she want referral for ortho and gave message to beth for referral for ortho

## 2018-06-03 DIAGNOSIS — G300 Alzheimer's disease with early onset: Secondary | ICD-10-CM | POA: Diagnosis not present

## 2018-06-04 DIAGNOSIS — G300 Alzheimer's disease with early onset: Secondary | ICD-10-CM | POA: Diagnosis not present

## 2018-06-05 DIAGNOSIS — G300 Alzheimer's disease with early onset: Secondary | ICD-10-CM | POA: Diagnosis not present

## 2018-06-06 ENCOUNTER — Other Ambulatory Visit: Payer: Self-pay

## 2018-06-06 ENCOUNTER — Emergency Department

## 2018-06-06 ENCOUNTER — Emergency Department
Admission: EM | Admit: 2018-06-06 | Discharge: 2018-06-06 | Disposition: A | Discharge status: Skilled Nursing Facility | Attending: Emergency Medicine | Admitting: Emergency Medicine

## 2018-06-06 DIAGNOSIS — G309 Alzheimer's disease, unspecified: Secondary | ICD-10-CM | POA: Insufficient documentation

## 2018-06-06 DIAGNOSIS — Z79899 Other long term (current) drug therapy: Secondary | ICD-10-CM | POA: Insufficient documentation

## 2018-06-06 DIAGNOSIS — W19XXXA Unspecified fall, initial encounter: Secondary | ICD-10-CM | POA: Diagnosis not present

## 2018-06-06 DIAGNOSIS — S0101XA Laceration without foreign body of scalp, initial encounter: Secondary | ICD-10-CM | POA: Insufficient documentation

## 2018-06-06 DIAGNOSIS — S0990XA Unspecified injury of head, initial encounter: Secondary | ICD-10-CM | POA: Diagnosis not present

## 2018-06-06 DIAGNOSIS — R404 Transient alteration of awareness: Secondary | ICD-10-CM | POA: Diagnosis not present

## 2018-06-06 DIAGNOSIS — Z7401 Bed confinement status: Secondary | ICD-10-CM | POA: Diagnosis not present

## 2018-06-06 DIAGNOSIS — Y929 Unspecified place or not applicable: Secondary | ICD-10-CM | POA: Diagnosis not present

## 2018-06-06 DIAGNOSIS — Z23 Encounter for immunization: Secondary | ICD-10-CM | POA: Insufficient documentation

## 2018-06-06 DIAGNOSIS — S199XXA Unspecified injury of neck, initial encounter: Secondary | ICD-10-CM | POA: Diagnosis not present

## 2018-06-06 DIAGNOSIS — Y999 Unspecified external cause status: Secondary | ICD-10-CM | POA: Insufficient documentation

## 2018-06-06 DIAGNOSIS — G300 Alzheimer's disease with early onset: Secondary | ICD-10-CM | POA: Diagnosis not present

## 2018-06-06 DIAGNOSIS — Y939 Activity, unspecified: Secondary | ICD-10-CM | POA: Insufficient documentation

## 2018-06-06 DIAGNOSIS — R58 Hemorrhage, not elsewhere classified: Secondary | ICD-10-CM | POA: Diagnosis not present

## 2018-06-06 DIAGNOSIS — F0390 Unspecified dementia without behavioral disturbance: Secondary | ICD-10-CM | POA: Diagnosis not present

## 2018-06-06 DIAGNOSIS — M2550 Pain in unspecified joint: Secondary | ICD-10-CM | POA: Diagnosis not present

## 2018-06-06 DIAGNOSIS — F0280 Dementia in other diseases classified elsewhere without behavioral disturbance: Secondary | ICD-10-CM

## 2018-06-06 MED ORDER — TETANUS-DIPHTH-ACELL PERTUSSIS 5-2.5-18.5 LF-MCG/0.5 IM SUSP
0.5000 mL | Freq: Once | INTRAMUSCULAR | Status: AC
  Administered 2018-06-06: 0.5 mL via INTRAMUSCULAR
  Filled 2018-06-06: qty 0.5

## 2018-06-06 NOTE — ED Notes (Signed)
Hospice nurse Becky: [phone]

## 2018-06-06 NOTE — Discharge Instructions (Signed)
Your CT scans do not show any serious injuries from the fall.

## 2018-06-06 NOTE — ED Provider Notes (Signed)
[facility] Emergency Department Provider Note  ____________________________________________  Time seen: Approximately 5:36 PM  I have reviewed the triage vital signs and the nursing notes.   HISTORY  Chief Complaint Fall    Level 5 Caveat: Portions of the History and Physical including HPI and review of systems are unable to be completely obtained due to chronic dementia HPI Joan Peters is a 73 y.o. female with a history of Alzheimer's dementia, hyperlipidemia, anxiety who is brought to the ED due to a witnessed fall at her nursing home.  She fell and hit her head, no loss of consciousness.  Patient denies any symptoms.  Not on blood thinners.      Past Medical History:  Diagnosis Date  . Allergy   . Alzheimer disease ([place])   . Anxiety   . Dementia ([place])   . Depression   . Hyperlipidemia   . Migraines   . Petit mal seizure status [facility])      Patient Active Problem List   Diagnosis Date Noted  . Agitation 01/16/2018  . Major depression, chronic 09/19/2017  . Mixed hyperlipidemia 06/15/2017  . Anxiety 06/15/2017  . Alzheimer disease ([place]) 06/15/2017  . Osteoporosis of multiple sites 06/15/2017     Past Surgical History:  Procedure Laterality Date  . ABDOMINAL HYSTERECTOMY    . CATARACT EXTRACTION    . CESAREAN SECTION    . right shoulder surgery       Prior to Admission medications   Medication Sig Start Date End Date Taking? Authorizing Provider  acetaminophen (TYLENOL) 325 MG tablet Take 650 mg by mouth 3 (three) times daily.   Yes [provider]  alum & mag hydroxide-simeth (MAALOX/MYLANTA) 200-200-20 MG/5ML suspension Take 30 mLs by mouth every 6 (six) hours as needed for indigestion.   Yes [provider]  citalopram (CELEXA) 10 MG tablet Take 1 tablet (10 mg total) by mouth daily. 03/06/18  Yes Boscia, Greer Ee, NP  clonazePAM (KLONOPIN) 0.25 MG disintegrating tablet Take 0.25 mg by mouth 2 (two) times  daily as needed for agitation. 01/06/18  Yes [provider]  divalproex (DEPAKOTE SPRINKLES) 125 MG capsule Take 2 capsules (250 mg total) by mouth 2 (two) times daily. 01/21/18  Yes Drenda Freeze, MD  loperamide (IMODIUM) 2 MG capsule Take 2 mg by mouth as needed for diarrhea or loose stools.   Yes [provider]  magnesium hydroxide (MILK OF MAGNESIA) 400 MG/5ML suspension Take 30 mLs by mouth at bedtime.   Yes [provider]  mirtazapine (REMERON) 15 MG tablet Take 15 mg by mouth at bedtime.   Yes [provider]  risperiDONE (RISPERDAL) 1 MG tablet Take 1 tablet (1 mg total) by mouth 3 (three) times daily. 01/15/18  Yes Boscia, Greer Ee, NP  sennosides (SENOKOT) 8.8 MG/5ML syrup Take 5 mLs by mouth daily.   Yes [provider]  simvastatin (ZOCOR) 40 MG tablet Take 1 tablet (40 mg total) by mouth at bedtime. 12/22/17  Yes Ronnell Freshwater, NP  traZODone (DESYREL) 50 MG tablet Take 1 tablet by mouth at bedtime as needed for sleep. 01/06/18  Yes [provider]  vitamin B-12 (CYANOCOBALAMIN) 1000 MCG tablet Take 1,000 mcg by mouth daily.   Yes [provider]  Vitamin D, Ergocalciferol, (DRISDOL) 1.25 MG (50000 UT) CAPS capsule Take 50,000 Units by mouth every 7 (seven) days.   Yes [provider]     Allergies Sulfa antibiotics and Ativan [lorazepam]  Family History  Problem Relation Age of Onset  . Hypertension Neg Hx   . Hyperlipidemia Neg Hx   . Lung cancer Neg Hx   . Lymphoma Neg Hx   . Alcoholism Neg Hx     Social History Social History   Tobacco Use  . Smoking status: Never Smoker  . Smokeless tobacco: Never Used  Substance Use Topics  . Alcohol use: Not Currently  . Drug use: No    Review of Systems Level 5 Caveat: Portions of the History and Physical including HPI and review of systems are unable to be completely obtained due to patient being a poor historian   Constitutional:   No  known fever.  ENT:   No rhinorrhea. Cardiovascular:   No chest pain or syncope. Respiratory:   No dyspnea or cough. Gastrointestinal:   Negative for abdominal pain, vomiting and diarrhea.  Musculoskeletal:   Negative for focal pain or swelling ____________________________________________   PHYSICAL EXAM:  VITAL SIGNS: ED Triage Vitals  Enc Vitals Group     BP 06/06/18 1547 135/77     Pulse Rate 06/06/18 1543 90     Resp 06/06/18 1543 17     Temp 06/06/18 1543 97.7 F (36.5 C)     Temp Source 06/06/18 1543 Axillary     SpO2 06/06/18 1543 93 %     Weight 06/06/18 1545 120 lb (54.4 kg)     Height 06/06/18 1545 5\' 3"  (1.6 m)     Head Circumference --      Peak Flow --      Pain Score --      Pain Loc --      Pain Edu? --      Excl. in [place]? --     Vital signs reviewed, nursing assessments reviewed.   Constitutional: Awake.  Not alert, not oriented. Non-toxic appearance. Eyes:   Conjunctivae are normal. EOMI. PERRL. ENT      Head:   Normocephalic with 2 cm curvilinear laceration over the right lateral orbit/zygoma.      Nose:   No congestion/rhinnorhea.  No epistaxis      Mouth/Throat:   MMM, no pharyngeal erythema. No peritonsillar mass.       Neck:   No meningismus. Full ROM.  No midline tenderness Hematological/Lymphatic/Immunilogical:   No cervical lymphadenopathy. Cardiovascular:   RRR. Symmetric bilateral radial and DP pulses.  No murmurs. Cap refill less than 2 seconds. Respiratory:   Normal respiratory effort without tachypnea/retractions. Breath sounds are clear and equal bilaterally. No wheezes/rales/rhonchi. Gastrointestinal:   Soft and nontender. Non distended. There is no CVA tenderness.  No rebound, rigidity, or guarding.  Musculoskeletal:   Normal range of motion in all extremities. No joint effusions.  No lower extremity tenderness.  No edema.  Pelvis stable, chest wall stable. Neurologic:   Confused, incoherent speech.  Motor grossly intact. No acute focal  neurologic deficits are appreciated.  Skin:    Skin is warm, dry with facial laceration as above.  No rash  ____________________________________________    LABS (pertinent positives/negatives) (all labs ordered are listed, but only abnormal results are displayed) Labs Reviewed - No data to display ____________________________________________   EKG    ____________________________________________    RADIOLOGY  Ct Head Wo Contrast  Result Date: 06/06/2018 CLINICAL DATA:  Fall.  Laceration to right eyebrow EXAM: CT HEAD WITHOUT CONTRAST CT CERVICAL SPINE WITHOUT CONTRAST TECHNIQUE: Multidetector CT imaging of the head and cervical spine was performed following the standard protocol  without intravenous contrast. Multiplanar CT image reconstructions of the cervical spine were also generated. COMPARISON:  01/21/2018 FINDINGS: CT HEAD FINDINGS Brain: There is atrophy and chronic small vessel disease changes. No acute intracranial abnormality. Specifically, no hemorrhage, hydrocephalus, mass lesion, acute infarction, or significant intracranial injury. Vascular: No hyperdense vessel or unexpected calcification. Skull: No acute calvarial abnormality. Sinuses/Orbits: Visualized paranasal sinuses and mastoids clear. Orbital soft tissues unremarkable. Other: None CT CERVICAL SPINE FINDINGS Alignment: Normal Skull base and vertebrae: No acute fracture. No primary bone lesion or focal pathologic process. Soft tissues and spinal canal: No prevertebral fluid or swelling. No visible canal hematoma. Disc levels: Disc space narrowing and spurring at C5-6 and C6-7. Mild degenerative facet disease bilaterally. Upper chest: Biapical scarring. Other: None IMPRESSION: Atrophy, chronic microvascular disease. No acute intracranial abnormality. Degenerative changes in the cervical spine. No acute bony abnormality. Electronically Signed   By: Rolm Baptise M.D.   On: 06/06/2018 17:10   Ct Cervical Spine Wo  Contrast  Result Date: 06/06/2018 CLINICAL DATA:  Fall.  Laceration to right eyebrow EXAM: CT HEAD WITHOUT CONTRAST CT CERVICAL SPINE WITHOUT CONTRAST TECHNIQUE: Multidetector CT imaging of the head and cervical spine was performed following the standard protocol without intravenous contrast. Multiplanar CT image reconstructions of the cervical spine were also generated. COMPARISON:  01/21/2018 FINDINGS: CT HEAD FINDINGS Brain: There is atrophy and chronic small vessel disease changes. No acute intracranial abnormality. Specifically, no hemorrhage, hydrocephalus, mass lesion, acute infarction, or significant intracranial injury. Vascular: No hyperdense vessel or unexpected calcification. Skull: No acute calvarial abnormality. Sinuses/Orbits: Visualized paranasal sinuses and mastoids clear. Orbital soft tissues unremarkable. Other: None CT CERVICAL SPINE FINDINGS Alignment: Normal Skull base and vertebrae: No acute fracture. No primary bone lesion or focal pathologic process. Soft tissues and spinal canal: No prevertebral fluid or swelling. No visible canal hematoma. Disc levels: Disc space narrowing and spurring at C5-6 and C6-7. Mild degenerative facet disease bilaterally. Upper chest: Biapical scarring. Other: None IMPRESSION: Atrophy, chronic microvascular disease. No acute intracranial abnormality. Degenerative changes in the cervical spine. No acute bony abnormality. Electronically Signed   By: Rolm Baptise M.D.   On: 06/06/2018 17:10    ____________________________________________   PROCEDURES .Marland KitchenLaceration Repair Date/Time: 06/06/2018 5:46 PM Performed by: Carrie Mew, MD Authorized by: Carrie Mew, MD   Consent:    Consent obtained:  Verbal   Consent given by:  Spouse (At bedside)   Risks discussed:  Infection, pain, retained foreign body, poor cosmetic result and poor wound healing   Alternatives discussed:  No treatment and referral Anesthesia (see MAR for exact dosages):     Anesthesia method:  None Laceration details:    Location:  Scalp   Scalp location:  R temporal   Length (cm):  2 Repair type:    Repair type:  Simple Pre-procedure details:    Preparation:  Patient was prepped and draped in usual sterile fashion and imaging obtained to evaluate for foreign bodies Exploration:    Hemostasis achieved with:  Direct pressure   Wound exploration: entire depth of wound probed and visualized     Wound extent: no foreign bodies/material noted and no vascular damage noted     Contaminated: no   Treatment:    Area cleansed with:  Saline   Amount of cleaning:  Standard   Irrigation solution:  Sterile saline   Visualized foreign bodies/material removed: no   Skin repair:    Repair method:  Tissue adhesive Approximation:    Approximation:  Close  Post-procedure details:    Dressing:  Open (no dressing)   Patient tolerance of procedure:  Tolerated well, no immediate complications    ____________________________________________  DIFFERENTIAL DIAGNOSIS   Subdural hematoma, epidural hematoma, cervical spine fracture  CLINICAL IMPRESSION / ASSESSMENT AND PLAN / ED COURSE  Pertinent labs & imaging results that were available during my care of the patient were reviewed by me and considered in my medical decision making (see chart for details).    Patient presents after a mechanical fall with trauma to the right side of the head.  Due to her age, chronic frailty and dementia, she is at high risk for a life-threatening intracranial hemorrhage, so CT scans were obtained.  On imaging there is no evidence of acute fracture or intracranial hemorrhage.  The wound was cleaned and repaired with tissue adhesive and patient is stable for outpatient follow-up.      ____________________________________________   FINAL CLINICAL IMPRESSION(S) / ED DIAGNOSES    Final diagnoses:  Alzheimer's dementia without behavioral disturbance, unspecified timing of dementia onset  ([place])  Injury of head, initial encounter     ED Discharge Orders    None      Portions of this note were generated with dragon dictation software. Dictation errors may occur despite best attempts at proofreading.   Carrie Mew, MD 06/06/18 [phone]

## 2018-06-06 NOTE — ED Notes (Signed)
Husband at bedside.  

## 2018-06-06 NOTE — ED Triage Notes (Addendum)
Pt to ED via EMS from [facility]. Pt is hospice pt. Pt had mechanical fall, has 1.5 inch lac to right eyebrow. Pt has hx of dementia/alzhimers. Per med list pt is not taking any blood thinners. Pt states she is not in any pain. NAD. VSS.

## 2018-06-06 NOTE — ED Notes (Signed)
Called [facility] for transport to [place]

## 2018-06-06 NOTE — ED Notes (Signed)
Patient transported to CT 

## 2018-06-06 NOTE — ED Notes (Signed)
Attempted to call report at this time 

## 2018-06-07 DIAGNOSIS — G300 Alzheimer's disease with early onset: Secondary | ICD-10-CM | POA: Diagnosis not present

## 2018-06-08 DIAGNOSIS — G300 Alzheimer's disease with early onset: Secondary | ICD-10-CM | POA: Diagnosis not present

## 2018-06-09 DIAGNOSIS — G300 Alzheimer's disease with early onset: Secondary | ICD-10-CM | POA: Diagnosis not present

## 2018-06-10 DIAGNOSIS — G300 Alzheimer's disease with early onset: Secondary | ICD-10-CM | POA: Diagnosis not present

## 2018-06-11 DIAGNOSIS — G300 Alzheimer's disease with early onset: Secondary | ICD-10-CM | POA: Diagnosis not present

## 2018-06-12 DIAGNOSIS — G300 Alzheimer's disease with early onset: Secondary | ICD-10-CM | POA: Diagnosis not present

## 2018-06-13 DIAGNOSIS — G300 Alzheimer's disease with early onset: Secondary | ICD-10-CM | POA: Diagnosis not present

## 2018-06-14 DIAGNOSIS — G300 Alzheimer's disease with early onset: Secondary | ICD-10-CM | POA: Diagnosis not present

## 2018-06-15 DIAGNOSIS — G300 Alzheimer's disease with early onset: Secondary | ICD-10-CM | POA: Diagnosis not present

## 2018-06-16 DIAGNOSIS — G300 Alzheimer's disease with early onset: Secondary | ICD-10-CM | POA: Diagnosis not present

## 2018-06-17 DIAGNOSIS — G300 Alzheimer's disease with early onset: Secondary | ICD-10-CM | POA: Diagnosis not present

## 2018-06-18 DIAGNOSIS — G300 Alzheimer's disease with early onset: Secondary | ICD-10-CM | POA: Diagnosis not present

## 2018-06-19 DIAGNOSIS — G300 Alzheimer's disease with early onset: Secondary | ICD-10-CM | POA: Diagnosis not present

## 2018-06-20 DIAGNOSIS — G300 Alzheimer's disease with early onset: Secondary | ICD-10-CM | POA: Diagnosis not present

## 2018-06-21 DIAGNOSIS — G300 Alzheimer's disease with early onset: Secondary | ICD-10-CM | POA: Diagnosis not present

## 2018-08-11 ENCOUNTER — Telehealth: Payer: Self-pay

## 2018-08-11 NOTE — Telephone Encounter (Signed)
Faxed mighty shakes use as directed 3 times a daily #100 with 3 to [facility] [phone]

## 2018-08-14 ENCOUNTER — Encounter: Payer: Self-pay | Admitting: Nurse Practitioner

## 2018-08-31 DIAGNOSIS — G300 Alzheimer's disease with early onset: Secondary | ICD-10-CM | POA: Diagnosis not present

## 2018-09-01 DIAGNOSIS — G300 Alzheimer's disease with early onset: Secondary | ICD-10-CM | POA: Diagnosis not present

## 2018-09-02 DIAGNOSIS — G300 Alzheimer's disease with early onset: Secondary | ICD-10-CM | POA: Diagnosis not present

## 2018-09-03 DIAGNOSIS — G300 Alzheimer's disease with early onset: Secondary | ICD-10-CM | POA: Diagnosis not present

## 2018-09-04 DIAGNOSIS — G300 Alzheimer's disease with early onset: Secondary | ICD-10-CM | POA: Diagnosis not present

## 2018-09-05 DIAGNOSIS — G300 Alzheimer's disease with early onset: Secondary | ICD-10-CM | POA: Diagnosis not present

## 2018-09-06 DIAGNOSIS — G300 Alzheimer's disease with early onset: Secondary | ICD-10-CM | POA: Diagnosis not present

## 2018-09-07 DIAGNOSIS — G300 Alzheimer's disease with early onset: Secondary | ICD-10-CM | POA: Diagnosis not present

## 2018-09-08 DIAGNOSIS — G300 Alzheimer's disease with early onset: Secondary | ICD-10-CM | POA: Diagnosis not present

## 2018-09-09 DIAGNOSIS — G300 Alzheimer's disease with early onset: Secondary | ICD-10-CM | POA: Diagnosis not present

## 2018-09-10 DIAGNOSIS — G300 Alzheimer's disease with early onset: Secondary | ICD-10-CM | POA: Diagnosis not present

## 2018-09-11 DIAGNOSIS — G300 Alzheimer's disease with early onset: Secondary | ICD-10-CM | POA: Diagnosis not present

## 2018-09-12 DIAGNOSIS — G300 Alzheimer's disease with early onset: Secondary | ICD-10-CM | POA: Diagnosis not present

## 2018-09-13 DIAGNOSIS — G300 Alzheimer's disease with early onset: Secondary | ICD-10-CM | POA: Diagnosis not present

## 2018-09-15 ENCOUNTER — Telehealth: Payer: Self-pay

## 2018-09-15 ENCOUNTER — Other Ambulatory Visit: Payer: Self-pay

## 2018-09-15 MED ORDER — VITAMIN B-12 1000 MCG PO TABS: 1000.0000 ug | ORAL_TABLET | Freq: Every day | ORAL | 3 refills | Status: AC

## 2018-09-15 MED ORDER — VITAMIN D (ERGOCALCIFEROL) 1.25 MG (50000 UNIT) PO CAPS: 50000.0000 [IU] | ORAL_CAPSULE | ORAL | 3 refills | Status: AC

## 2018-09-15 NOTE — Telephone Encounter (Signed)
Faxed [facility] pres for vitamind3 and vitamin b12

## 2018-09-28 DIAGNOSIS — G300 Alzheimer's disease with early onset: Secondary | ICD-10-CM | POA: Diagnosis not present

## 2018-09-29 DIAGNOSIS — G300 Alzheimer's disease with early onset: Secondary | ICD-10-CM | POA: Diagnosis not present

## 2018-09-30 DIAGNOSIS — G300 Alzheimer's disease with early onset: Secondary | ICD-10-CM | POA: Diagnosis not present

## 2018-10-01 DIAGNOSIS — G300 Alzheimer's disease with early onset: Secondary | ICD-10-CM | POA: Diagnosis not present

## 2018-10-02 DIAGNOSIS — G300 Alzheimer's disease with early onset: Secondary | ICD-10-CM | POA: Diagnosis not present

## 2018-10-03 DIAGNOSIS — G300 Alzheimer's disease with early onset: Secondary | ICD-10-CM | POA: Diagnosis not present

## 2018-10-04 DIAGNOSIS — G300 Alzheimer's disease with early onset: Secondary | ICD-10-CM | POA: Diagnosis not present

## 2018-10-05 DIAGNOSIS — G300 Alzheimer's disease with early onset: Secondary | ICD-10-CM | POA: Diagnosis not present

## 2018-10-06 DIAGNOSIS — G300 Alzheimer's disease with early onset: Secondary | ICD-10-CM | POA: Diagnosis not present

## 2018-10-07 DIAGNOSIS — G300 Alzheimer's disease with early onset: Secondary | ICD-10-CM | POA: Diagnosis not present

## 2018-10-08 DIAGNOSIS — G300 Alzheimer's disease with early onset: Secondary | ICD-10-CM | POA: Diagnosis not present

## 2018-10-09 DIAGNOSIS — G300 Alzheimer's disease with early onset: Secondary | ICD-10-CM | POA: Diagnosis not present

## 2018-10-10 DIAGNOSIS — G300 Alzheimer's disease with early onset: Secondary | ICD-10-CM | POA: Diagnosis not present

## 2018-10-11 DIAGNOSIS — G300 Alzheimer's disease with early onset: Secondary | ICD-10-CM | POA: Diagnosis not present

## 2018-10-19 ENCOUNTER — Telehealth: Payer: Self-pay

## 2018-10-19 NOTE — Telephone Encounter (Signed)
Hospice called [facility] [phone] for pt to pur on morphine and lorazepam because pt is not active and not eating as per dr Humphrey Rolls advised to call their medical dr

## 2018-10-31 DEATH — deceased

## 2019-01-26 ENCOUNTER — Telehealth: Payer: Self-pay

## 2019-01-26 NOTE — Telephone Encounter (Signed)
HOSPICE ORDERS SIGNED AND PLACED IN HOSPICE FOLDER.

## 2019-01-28 ENCOUNTER — Telehealth: Payer: Self-pay

## 2019-01-28 NOTE — Telephone Encounter (Signed)
PHYSICIAN ORDER SIGNED AND FAXED TO MELIS[facility] AT [facility] 01-28-19.

## 2019-02-18 ENCOUNTER — Ambulatory Visit: Payer: Medicare HMO | Admitting: Nurse Practitioner

## 2020-04-20 IMAGING — CT CT CERVICAL SPINE W/O CM
5 of 8 series · 12 of 33 positions shown, 13 images · non-contrast
Comparison: 01/21/2018

CLINICAL DATA: Fall.  Laceration to right eyebrow

EXAM:
CT HEAD WITHOUT CONTRAST
CT CERVICAL SPINE WITHOUT CONTRAST
TECHNIQUE: Multidetector CT imaging of the head and cervical spine was
performed following the standard protocol without intravenous
contrast. Multiplanar CT image reconstructions of the cervical spine
were also generated.

[Series 3: head bone · axial · 0.47mm/px · z∈[-101,-53]mm · 2 of 73 slices shown]
[im 25/73  bone]
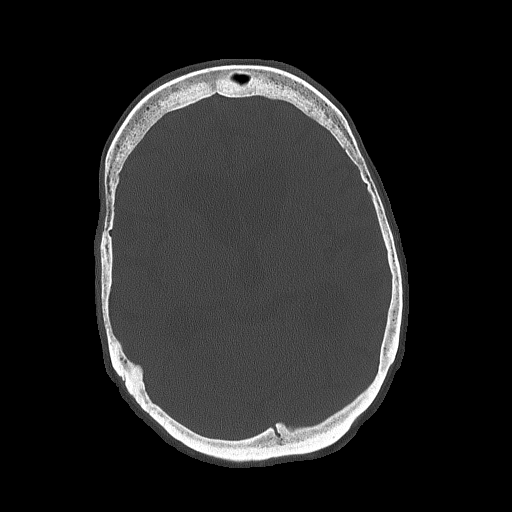
[im 49/73  bone]
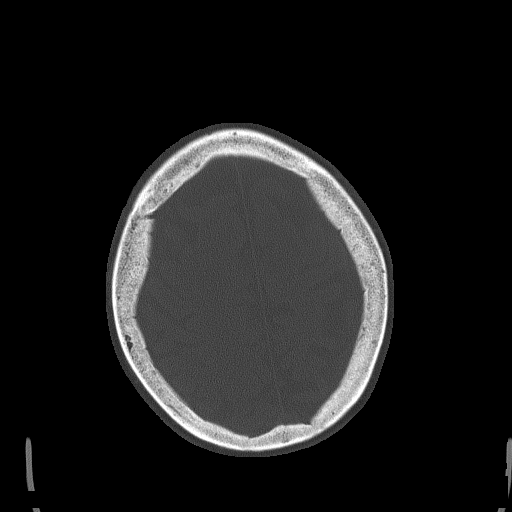

[Series 5: c spine soft · axial · 0.33mm/px · z∈[-240,-192]mm · 2 of 68 slices shown]
[im 23/68  soft-tissue]
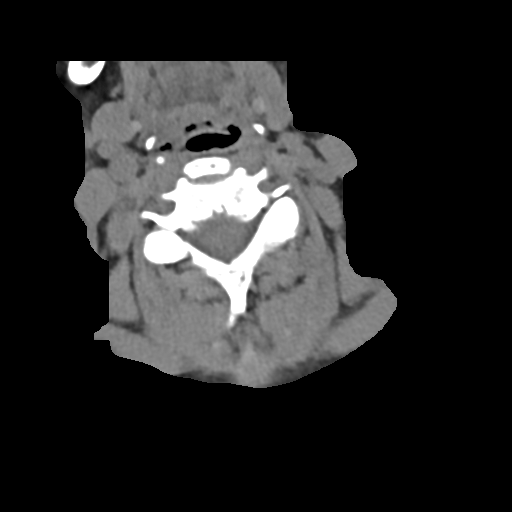
[im 45/68  soft-tissue]
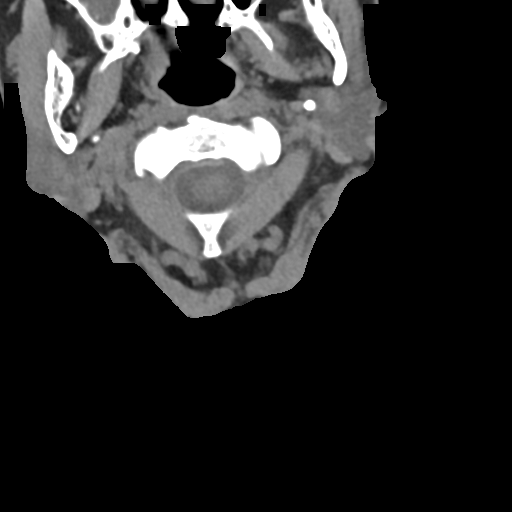

[Series 8: sagittal bone · sagittal · 0.23mm/px · 5 of 65 slices shown]
[im 11/65  bone]
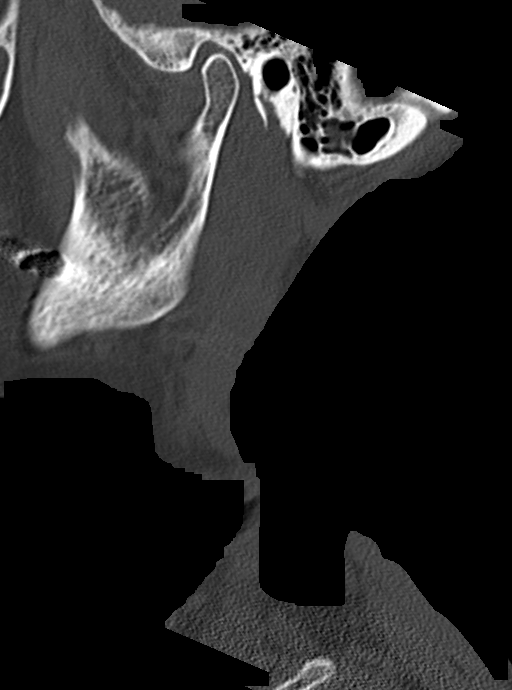
[im 22/65  bone]
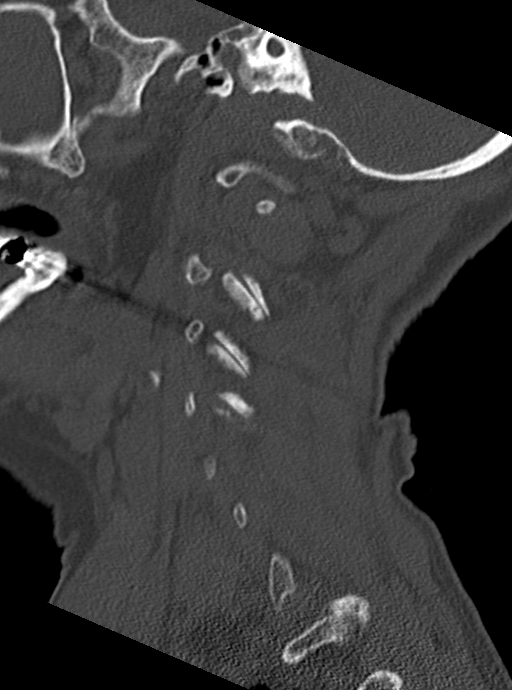
[im 33/65  bone]
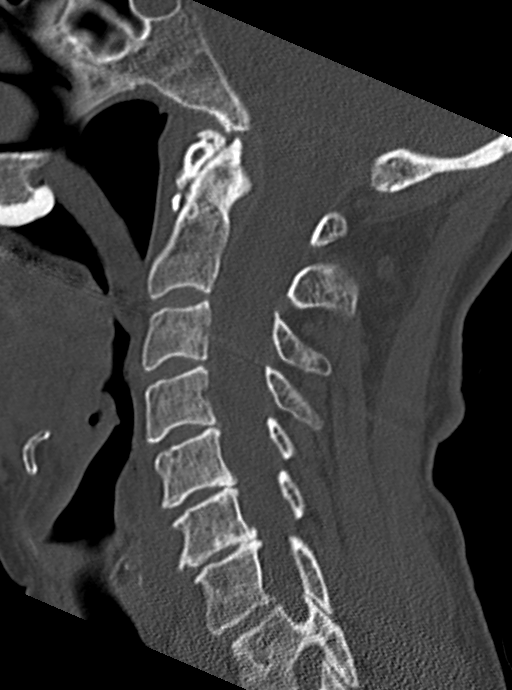
[im 43/65  bone]
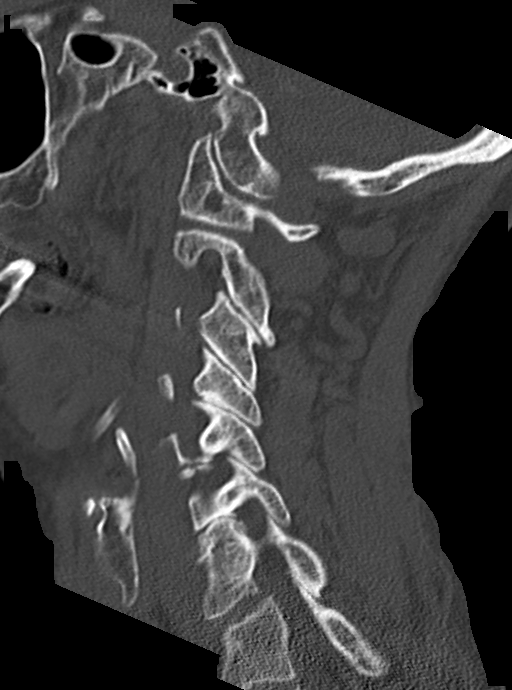
[im 54/65  bone]
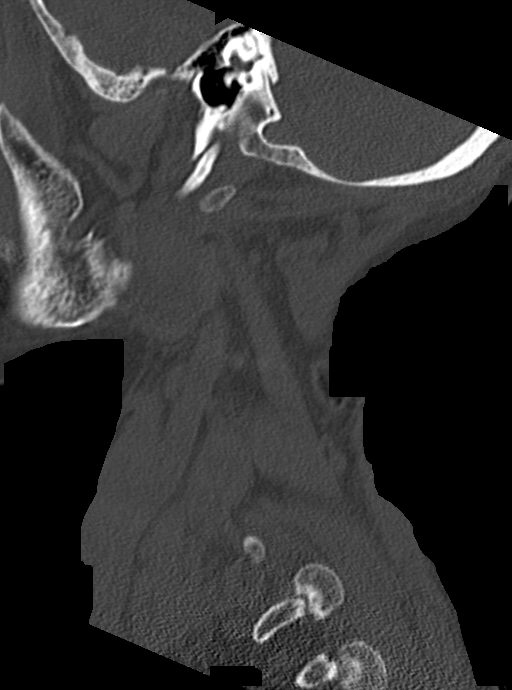

[Series 10: orthogonal bone · axial · 0.23mm/px · z∈[-261,-213]mm · 2 of 80 slices shown, 3 images]
[im 27/80  soft-tissue]
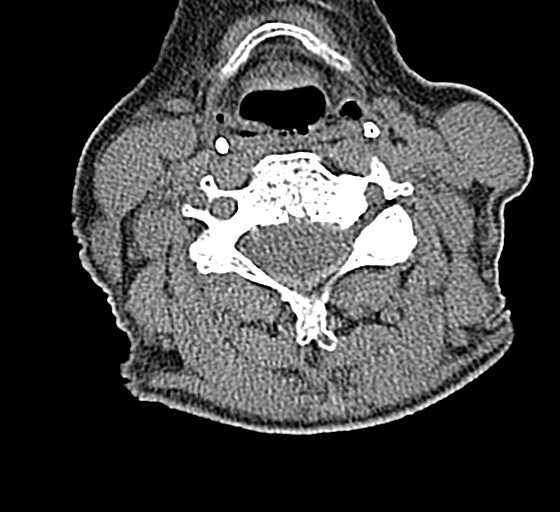
[im 27/80  bone]
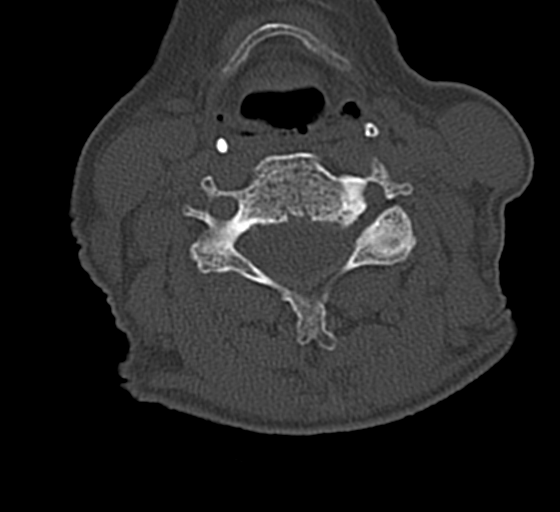
[im 53/80  bone]
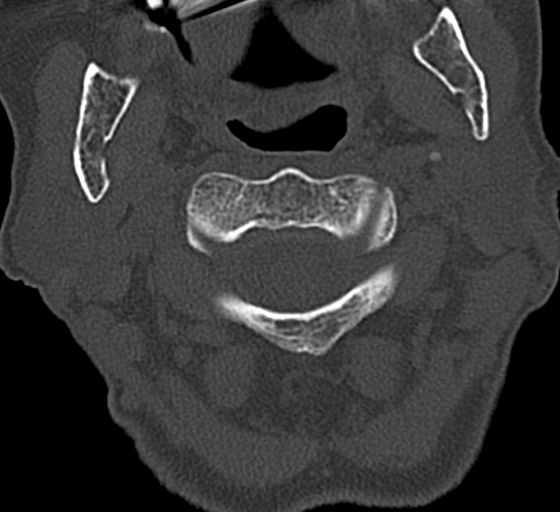

[Series 13: coronal bone ... · coronal · 0.25mm/px · 1 of 60 slices shown]
[im 30/60  bone]
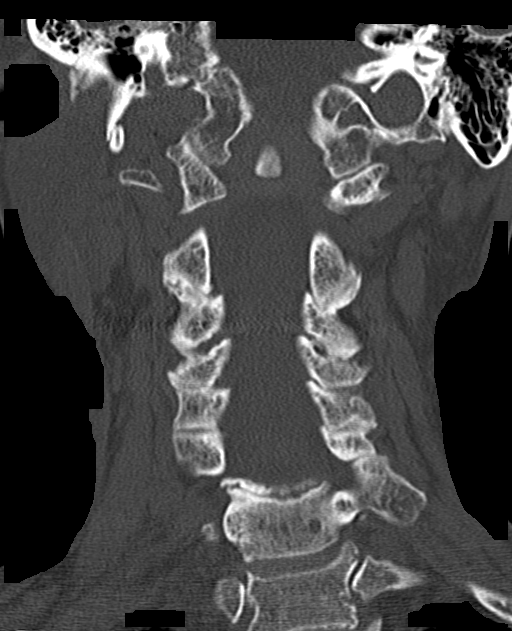

[12 of 33 positions shown; findings below may reference images not displayed]

FINDINGS: CT HEAD FINDINGS

Brain: There is atrophy and chronic small vessel disease changes. No
acute intracranial abnormality. Specifically, no hemorrhage,
hydrocephalus, mass lesion, acute infarction, or significant
intracranial injury.

Vascular: No hyperdense vessel or unexpected calcification.

Skull: No acute calvarial abnormality.

Sinuses/Orbits: Visualized paranasal sinuses and mastoids clear.
Orbital soft tissues unremarkable.

Other: None

CT CERVICAL SPINE FINDINGS

Alignment: Normal

Skull base and vertebrae: No acute fracture. No primary bone lesion
or focal pathologic process.

Soft tissues and spinal canal: No prevertebral fluid or swelling. No
visible canal hematoma.

Disc levels: Disc space narrowing and spurring at C5-6 and C6-7.
Mild degenerative facet disease bilaterally.

Upper chest: Biapical scarring.

Other: None
IMPRESSION: Atrophy, chronic microvascular disease.

No acute intracranial abnormality.

Degenerative changes in the cervical spine. No acute bony
abnormality.
